# Patient Record
Sex: Female | Born: 1935
Health system: Southern US, Community
[De-identification: ages and names within clinical notes are randomized; demographics above are authoritative.]

## PROBLEM LIST (undated history)

## (undated) DIAGNOSIS — F0390 Unspecified dementia without behavioral disturbance: Secondary | ICD-10-CM

---

## 1998-09-15 ENCOUNTER — Other Ambulatory Visit: Admission: RE | Admit: 1998-09-15 | Discharge: 1998-09-15 | Payer: Self-pay | Admitting: *Deleted

## 1999-09-25 ENCOUNTER — Other Ambulatory Visit: Admission: RE | Admit: 1999-09-25 | Discharge: 1999-09-25 | Payer: Self-pay | Admitting: *Deleted

## 2000-09-25 ENCOUNTER — Other Ambulatory Visit: Admission: RE | Admit: 2000-09-25 | Discharge: 2000-09-25 | Payer: Self-pay | Admitting: *Deleted

## 2002-09-30 ENCOUNTER — Other Ambulatory Visit: Admission: RE | Admit: 2002-09-30 | Discharge: 2002-09-30 | Payer: Self-pay | Admitting: Family Medicine

## 2003-10-14 ENCOUNTER — Other Ambulatory Visit: Admission: RE | Admit: 2003-10-14 | Discharge: 2003-10-14 | Payer: Self-pay | Admitting: Family Medicine

## 2004-10-16 ENCOUNTER — Other Ambulatory Visit: Admission: RE | Admit: 2004-10-16 | Discharge: 2004-10-16 | Payer: Self-pay | Admitting: Family Medicine

## 2004-10-31 ENCOUNTER — Ambulatory Visit: Payer: Self-pay | Admitting: Family Medicine

## 2015-05-30 ENCOUNTER — Other Ambulatory Visit: Payer: Self-pay

## 2015-08-02 DIAGNOSIS — Z803 Family history of malignant neoplasm of breast: Secondary | ICD-10-CM | POA: Diagnosis not present

## 2015-08-02 DIAGNOSIS — Z1231 Encounter for screening mammogram for malignant neoplasm of breast: Secondary | ICD-10-CM | POA: Diagnosis not present

## 2015-11-04 DIAGNOSIS — W57XXXA Bitten or stung by nonvenomous insect and other nonvenomous arthropods, initial encounter: Secondary | ICD-10-CM | POA: Diagnosis not present

## 2015-11-22 DIAGNOSIS — L600 Ingrowing nail: Secondary | ICD-10-CM | POA: Diagnosis not present

## 2015-11-22 DIAGNOSIS — M2022 Hallux rigidus, left foot: Secondary | ICD-10-CM | POA: Diagnosis not present

## 2015-11-22 DIAGNOSIS — M2021 Hallux rigidus, right foot: Secondary | ICD-10-CM | POA: Diagnosis not present

## 2015-11-22 DIAGNOSIS — L03031 Cellulitis of right toe: Secondary | ICD-10-CM | POA: Diagnosis not present

## 2015-12-14 DIAGNOSIS — R413 Other amnesia: Secondary | ICD-10-CM | POA: Diagnosis not present

## 2015-12-16 ENCOUNTER — Other Ambulatory Visit: Payer: Self-pay | Admitting: Family Medicine

## 2015-12-16 DIAGNOSIS — R413 Other amnesia: Secondary | ICD-10-CM

## 2015-12-29 ENCOUNTER — Ambulatory Visit
Admission: RE | Admit: 2015-12-29 | Discharge: 2015-12-29 | Disposition: A | Discharge status: Home / Self Care | Payer: PPO | Source: Ambulatory Visit | Attending: Family Medicine | Admitting: Family Medicine

## 2015-12-29 DIAGNOSIS — R413 Other amnesia: Secondary | ICD-10-CM | POA: Diagnosis not present

## 2016-01-19 DIAGNOSIS — R413 Other amnesia: Secondary | ICD-10-CM | POA: Diagnosis not present

## 2016-01-19 DIAGNOSIS — M810 Age-related osteoporosis without current pathological fracture: Secondary | ICD-10-CM | POA: Diagnosis not present

## 2016-03-08 DIAGNOSIS — R208 Other disturbances of skin sensation: Secondary | ICD-10-CM | POA: Diagnosis not present

## 2016-03-08 DIAGNOSIS — B078 Other viral warts: Secondary | ICD-10-CM | POA: Diagnosis not present

## 2016-03-08 DIAGNOSIS — L821 Other seborrheic keratosis: Secondary | ICD-10-CM | POA: Diagnosis not present

## 2016-06-15 DIAGNOSIS — R413 Other amnesia: Secondary | ICD-10-CM | POA: Diagnosis not present

## 2016-06-15 DIAGNOSIS — R350 Frequency of micturition: Secondary | ICD-10-CM | POA: Diagnosis not present

## 2016-06-15 DIAGNOSIS — Z1211 Encounter for screening for malignant neoplasm of colon: Secondary | ICD-10-CM | POA: Diagnosis not present

## 2016-06-25 DIAGNOSIS — Z0000 Encounter for general adult medical examination without abnormal findings: Secondary | ICD-10-CM | POA: Diagnosis not present

## 2016-06-25 DIAGNOSIS — R195 Other fecal abnormalities: Secondary | ICD-10-CM | POA: Diagnosis not present

## 2016-06-25 DIAGNOSIS — R413 Other amnesia: Secondary | ICD-10-CM | POA: Diagnosis not present

## 2016-06-26 DIAGNOSIS — Z1211 Encounter for screening for malignant neoplasm of colon: Secondary | ICD-10-CM | POA: Diagnosis not present

## 2016-08-07 DIAGNOSIS — R899 Unspecified abnormal finding in specimens from other organs, systems and tissues: Secondary | ICD-10-CM | POA: Diagnosis not present

## 2016-08-22 ENCOUNTER — Ambulatory Visit (INDEPENDENT_AMBULATORY_CARE_PROVIDER_SITE_OTHER): Payer: PPO | Admitting: Neurology

## 2016-08-22 ENCOUNTER — Encounter: Payer: Self-pay | Admitting: Neurology

## 2016-08-22 VITALS — BP 126/72 | HR 62 | Ht 65.5 in | Wt 114.1 lb

## 2016-08-22 DIAGNOSIS — G3184 Mild cognitive impairment, so stated: Secondary | ICD-10-CM

## 2016-08-22 NOTE — Progress Notes (Signed)
NEUROLOGY CONSULTATION NOTE  Jaclyn Robertson Fazzino MRN: [id number] DOB: 10-04-1934  Referring provider: Chauncey ReadingKristin Gordon, PA-C Primary care provider: Dr. Joette CatchingLeonard Nyland  Reason for consult:  Memory loss  Thank you for your kind referral of Jaclyn Robertson Mclees for consultation of the above symptoms. Although her history is well known to you, please allow me to reiterate it for the purpose of our medical record. The patient was accompanied to the clinic by her husband who also provides collateral information. Records and images were personally reviewed where available.  HISTORY OF PRESENT ILLNESS: This is a 82 year old left-handed woman presenting for evaluation of worsening memory. Her husband stated noticing changes a couple of years ago, she would sometimes ask the same questions repeatedly. She talked to her prior PCP about this last fall, she would mostly have difficulty remembering names, schedules. She would get lost driving, forgetting she was supposed to turn. This has happened more in unfamiliar roads, but sometimes she has missed a turn going home. She denies any missed bill payments, no missed medications. She continues to cook without leaving the stove on. She misplaces things frequently. She has been told she repeats herself, but reports that hearing is also a problem. She has noticed more word-finding difficulties in the past few months. 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 She has been taking Aricept 10mg  daily and has had "wild crazy dreams" at night. She states Xanax was started for the dreams. She denies any anxiety but her husband states she gets upset at herself easily. No paranoia or hallucinations.   She denies any headaches, dizziness, diplopia, dysarthria, dysphagia, neck/back pain,  focal numbness/tingling/weakness. She has very frequent urination with rare incontinence, occasional constipation. No anosmia, tremors, no falls. She denies any significant head injuries. No family history of dementia. She drinks 1-2 glasses of wine every night.    PAST MEDICAL HISTORY: History reviewed. No pertinent past medical history.  PAST SURGICAL HISTORY: History reviewed. No pertinent surgical history.  MEDICATIONS:  Outpatient Encounter Prescriptions as of 08/22/2016  Medication Sig  . ALPRAZolam (XANAX) 0.25 MG tablet TAKE 1 TABLET AT BEDTIME ONLY AS NEEDED FOR ANXIETY/SLEEP  . donepezil (ARICEPT) 10 MG tablet TAKE ONE TABLET AT BEDTIME  . Multiple Vitamins-Minerals (CENTRUM SILVER ULTRA WOMENS) TABS Take by mouth.  . raloxifene (EVISTA) 60 MG tablet TAKE 1 TABLET ONCE A DAY   No facility-administered encounter medications on file as of 08/22/2016.     ALLERGIES: Allergies not on file  FAMILY HISTORY: No family history on file.  SOCIAL HISTORY: Social History   Social History  . Marital status: Married    Spouse name: [name]  . Number of children: [name]  . Years of education: [name]   Occupational History  . Not on file.   Social History Main Topics  . Smoking status: Not on file  . Smokeless tobacco: Not on file  . Alcohol use Not on file  . Drug use: Unknown  . Sexual activity: Not on file   Other Topics Concern  . Not on file   Social History Narrative  . No narrative on file    REVIEW OF SYSTEMS: Constitutional: No fevers, chills, or sweats, no generalized fatigue, change in appetite Eyes: No visual changes, double vision, eye pain Ear,  nose and throat: No hearing loss, ear pain, nasal congestion, sore throat Cardiovascular: No chest pain, palpitations Respiratory:  No shortness of breath at rest or with exertion, wheezes GastrointestinaI: No nausea, vomiting, diarrhea, abdominal pain, fecal incontinence Genitourinary:  No dysuria, urinary retention,  +frequency Musculoskeletal:  No neck pain, back pain Integumentary: No rash, pruritus, skin lesions Neurological: as above Psychiatric: No depression, insomnia, anxiety Endocrine: No palpitations, fatigue, diaphoresis, mood swings, change in appetite, change in weight, increased thirst Hematologic/Lymphatic:  No anemia, purpura, petechiae. Allergic/Immunologic: no itchy/runny eyes, nasal congestion, recent allergic reactions, rashes  PHYSICAL EXAM: Vitals:   08/22/16 1035  BP: 126/72  Pulse: 62   General: No acute distress Head:  Normocephalic/atraumatic Eyes: Fundoscopic exam shows bilateral sharp discs, no vessel changes, exudates, or hemorrhages Neck: supple, no paraspinal tenderness, full range of motion Back: No paraspinal tenderness Heart: regular rate and rhythm Lungs: Clear to auscultation bilaterally. Vascular: No carotid bruits. Skin/Extremities: No rash, no edema Neurological Exam: Mental status: alert and oriented to person, place, and time, no dysarthria or aphasia, Fund of knowledge is appropriate.  Remote memory intact.  Attention and concentration are normal.    Able to name objects and repeat phrases.  Montreal Cognitive Assessment  08/22/2016  Visuospatial/ Executive (0/5) 5  Naming (0/3) 3  Attention: Read list of digits (0/2) 2  Attention: Read list of letters (0/1) 1  Attention: Serial 7 subtraction starting at 100 (0/3) 3  Language: Repeat phrase (0/2) 2  Language : Fluency (0/1) 1  Abstraction (0/2) 1  Delayed Recall (0/5) 0  Orientation (0/6) 5  Total 23   Cranial nerves: CN I: not tested CN II: pupils equal, round and reactive to light, visual fields intact, fundi unremarkable. CN III, IV, VI:  full range of motion, no nystagmus, no ptosis CN V: facial sensation intact CN VII: upper and lower face symmetric CN VIII: hearing intact to finger rub CN IX, X: gag intact, uvula midline CN XI: sternocleidomastoid and trapezius muscles intact CN XII:  tongue midline Bulk & Tone: normal, no fasciculations. Motor: 5/5 throughout with no pronator drift. Sensation: intact to light touch, cold, pin, vibration and joint position sense.  No extinction to double simultaneous stimulation.  Romberg test negative Deep Tendon Reflexes: +2 throughout, no ankle clonus Plantar responses: downgoing bilaterally Cerebellar: no incoordination on finger to nose, heel to shin. No dysdiadochokinesia Gait: narrow-based and steady, able to tandem walk adequately. Tremor: none  IMPRESSION: This is a 82 year old left-handed woman presenting for evaluation of worsening memory. Her neurological exam is non-focal, MOCA score today 23/30, indicating mild cognitive impairment. We discussed that mild cognitive impairment means there are serious cognitive problems by report and testing but the patient is functioning normally. Around 50% of MCI patients progress to dementia (functional impairment) over 5 years. Dementia implies that ADLs are currently compromised. MRI brain without contrast will be ordered to assess for underlying structural abnormality and assess vascular load. We discussed different causes of memory changes, check TSH and B12 is not done. We discussed effects of anxiety and stress on memory, she is noted to be somewhat anxious in the office today. She is taking Aricept 10mg  daily, continue current dose. She reports weird dreams, which can be a side effect of Aricept, she will start taking it in the daytime. We discussed the importance of control of blood pressure, cholesterol, as well as physical exercise and brain stimulation exercises for brain health. Continue to monitor driving. She will follow-up  in 6 months and knows to call for any changes.   Thank you for allowing me to participate in the care of this patient. Please do not hesitate to call for any questions or concerns.   Patrcia Dolly, M.D.  CC: Chauncey Reading, PA-C

## 2016-08-22 NOTE — Patient Instructions (Signed)
1. Schedule MRI brain without contrast 2. Continue Aricept 10mg , take it in the morning instead 3. Continue to monitor anxiety/stress as this can affect memory 4. Control of blood pressure, cholesterol, as well as physical exercise and brain stimulation exercises are important for brain health. Look into the Mediterranean diet, which has been found to be helpful for memory as well 5. Monitor driving 6. Follow-up in 6 months, call for any changes

## 2016-08-23 DIAGNOSIS — G3184 Mild cognitive impairment, so stated: Secondary | ICD-10-CM | POA: Insufficient documentation

## 2016-09-04 ENCOUNTER — Telehealth: Payer: Self-pay | Admitting: Neurology

## 2016-09-04 NOTE — Telephone Encounter (Signed)
[facility] and schedule.

## 2016-09-04 NOTE — Telephone Encounter (Signed)
Gerda DissJulie Rains 19-Oct-2034. Her # [phone] Erin (Daughter) called regarding an MRI for her mother. She is unsure if her mother has received a call regarding setting up the MRI. Thank you

## 2016-09-17 ENCOUNTER — Telehealth: Payer: Self-pay | Admitting: Neurology

## 2016-09-17 NOTE — Telephone Encounter (Signed)
PT called and said she does not remember what she is supposed to do that Dr Karel JarvisAquino suggested/Dawn CB# [phone]

## 2016-09-17 NOTE — Telephone Encounter (Signed)
Clld pt - LMOVMTC re her call this morning.

## 2016-09-25 ENCOUNTER — Ambulatory Visit
Admission: RE | Admit: 2016-09-25 | Discharge: 2016-09-25 | Disposition: A | Discharge status: Home / Self Care | Payer: PPO | Source: Ambulatory Visit | Attending: Neurology | Admitting: Neurology

## 2016-09-25 DIAGNOSIS — R413 Other amnesia: Secondary | ICD-10-CM | POA: Diagnosis not present

## 2016-09-25 DIAGNOSIS — G3184 Mild cognitive impairment, so stated: Secondary | ICD-10-CM

## 2016-10-04 ENCOUNTER — Telehealth: Payer: Self-pay | Admitting: Neurology

## 2016-10-04 NOTE — Telephone Encounter (Signed)
PT wanted you to have her cell phone number  CB# [phone]

## 2016-10-04 NOTE — Telephone Encounter (Signed)
PT returned your call regarding MRI results

## 2016-10-05 NOTE — Telephone Encounter (Signed)
Refer to lab results note. 

## 2016-10-23 DIAGNOSIS — R350 Frequency of micturition: Secondary | ICD-10-CM | POA: Diagnosis not present

## 2016-10-23 DIAGNOSIS — R4189 Other symptoms and signs involving cognitive functions and awareness: Secondary | ICD-10-CM | POA: Diagnosis not present

## 2016-10-23 DIAGNOSIS — R634 Abnormal weight loss: Secondary | ICD-10-CM | POA: Diagnosis not present

## 2016-10-23 DIAGNOSIS — G4700 Insomnia, unspecified: Secondary | ICD-10-CM | POA: Diagnosis not present

## 2016-12-12 DIAGNOSIS — S7012XA Contusion of left thigh, initial encounter: Secondary | ICD-10-CM | POA: Diagnosis not present

## 2016-12-12 DIAGNOSIS — T148XXA Other injury of unspecified body region, initial encounter: Secondary | ICD-10-CM | POA: Diagnosis not present

## 2016-12-12 DIAGNOSIS — S300XXA Contusion of lower back and pelvis, initial encounter: Secondary | ICD-10-CM | POA: Diagnosis not present

## 2016-12-12 DIAGNOSIS — W19XXXA Unspecified fall, initial encounter: Secondary | ICD-10-CM | POA: Diagnosis not present

## 2016-12-14 DIAGNOSIS — M25552 Pain in left hip: Secondary | ICD-10-CM | POA: Diagnosis not present

## 2017-02-13 DIAGNOSIS — R350 Frequency of micturition: Secondary | ICD-10-CM | POA: Diagnosis not present

## 2017-02-13 DIAGNOSIS — R413 Other amnesia: Secondary | ICD-10-CM | POA: Diagnosis not present

## 2017-02-20 ENCOUNTER — Ambulatory Visit (INDEPENDENT_AMBULATORY_CARE_PROVIDER_SITE_OTHER): Payer: PPO | Admitting: Neurology

## 2017-02-20 ENCOUNTER — Encounter: Payer: Self-pay | Admitting: Neurology

## 2017-02-20 VITALS — BP 112/70 | HR 48 | Ht 64.5 in | Wt 113.0 lb

## 2017-02-20 DIAGNOSIS — G3184 Mild cognitive impairment, so stated: Secondary | ICD-10-CM | POA: Diagnosis not present

## 2017-02-20 NOTE — Progress Notes (Signed)
NEUROLOGY FOLLOW UP OFFICE NOTE  Jaclyn Robertson [id number] September 13, 1934  HISTORY OF PRESENT ILLNESS: I had the pleasure of seeing Jaclyn Robertson in follow-up in the neurology clinic on 02/20/2017.  The patient was last seen 6 months ago for worsening memory. MOCA score in February 2018 was 23/30. She is again accompanied by her husband who helps supplement the history today.  Records and images were personally reviewed where available.  I personally reviewed MRI brain without contrast done 09/25/16 which did not show any acute changes, there was moderate diffuse atrophy, mild chronic microvascular disease. She was taking Aricept, Namenda was added by her PCP last May. She is not sure if she is taking the Namenda. She states she only takes 3 pills every morning, remembers she takes a multivitamin, Evista, and maybe Donepezil. She reported she does not take anything at night, then later on stated she takes Xanax for sleep. She repeats several times during the visit that they had asked the pharmacy to fax us what she is taking. She does not think her memory is very good, her husband agrees, and speaks to me privately after the visit to express concern about Alzheimer's disease. She denies getting lost driving, she only drives locally. She states she knows where she is, but sometimes has to think of where she wants to go. Her husband is mostly in charge of bills, she she keeps up with the some of her own bills. She has word-finding difficulties. No personality changes. No difficulties with ADLs. She denies any headaches, dizziness, diplopia, dysarthria, dysphagia, neck/back pain, focal numbness/tingling/weakness. No falls.   HPI 08/22/2016: This is a 82 yo LH woman with worsening memory. Her husband stated noticing changes a couple of years ago, she would sometimes ask the same questions repeatedly. She talked to her prior PCP about this last fall, she would mostly have difficulty remembering names, schedules. She  would get lost driving, forgetting she was supposed to turn. This has happened more in unfamiliar roads, but sometimes she has missed a turn going home. She denies any missed bill payments, no missed medications. She continues to cook without leaving the stove on. She misplaces things frequently. She has been told she repeats herself, but reports that hearing is also a problem. She has noticed more word-finding difficulties in the past few months. 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 She has been taking Aricept 10mg  daily and has had "wild crazy dreams" at night. She states Xanax was started for the dreams. She denies any anxiety but her husband states she gets upset at herself easily. No paranoia or hallucinations. She denies any significant head injuries. No family history of dementia. She drinks 1-2 glasses of wine every night.   PAST MEDICAL HISTORY: No past medical history on file.  MEDICATIONS:  Outpatient Encounter Prescriptions as of 02/20/2017  Medication Sig  . ALPRAZolam (XANAX) 0.25 MG tablet TAKE 1 TABLET AT BEDTIME ONLY AS NEEDED FOR ANXIETY/SLEEP  . donepezil (ARICEPT) 10 MG tablet TAKE ONE TABLET AT BEDTIME  . Multiple Vitamins-Minerals (CENTRUM SILVER ULTRA WOMENS) TABS Take by mouth.  . raloxifene (EVISTA) 60 MG tablet TAKE 1 TABLET ONCE A DAY   No facility-administered encounter medications on file as of 02/20/2017.     ALLERGIES: Allergies  Allergen  Reactions  . Levaquin  [Levofloxacin In D5w] Rash    FAMILY HISTORY: Family History  Problem Relation Age of Onset  . Seizures Mother     SOCIAL HISTORY: Social History   Social History  . Marital status: Married    Spouse name: [name]  . Number of children: [name]  . Years of education: [name]   Occupational History  .  Not on file.   Social History Main Topics  . Smoking status: Never Smoker  . Smokeless tobacco: Never Used  . Alcohol use 1.2 oz/week    2 Glasses of wine per week     Comment: Occ.  . Drug use: No  . Sexual activity: Not on file   Other Topics Concern  . Not on file   Social History Narrative  . No narrative on file    REVIEW OF SYSTEMS: Constitutional: No fevers, chills, or sweats, no generalized fatigue, change in appetite Eyes: No visual changes, double vision, eye pain Ear, nose and throat: No hearing loss, ear pain, nasal congestion, sore throat Cardiovascular: No chest pain, palpitations Respiratory:  No shortness of breath at rest or with exertion, wheezes GastrointestinaI: No nausea, vomiting, diarrhea, abdominal pain, fecal incontinence Genitourinary:  No dysuria, urinary retention or frequency Musculoskeletal:  No neck pain, back pain Integumentary: No rash, pruritus, skin lesions Neurological: as above Psychiatric: No depression, insomnia, anxiety Endocrine: No palpitations, fatigue, diaphoresis, mood swings, change in appetite, change in weight, increased thirst Hematologic/Lymphatic:  No anemia, purpura, petechiae. Allergic/Immunologic: no itchy/runny eyes, nasal congestion, recent allergic reactions, rashes  PHYSICAL EXAM: Vitals:   02/20/17 0959  BP: 112/70  Pulse: (!) 48   General: No acute distress Head:  Normocephalic/atraumatic Neck: supple, no paraspinal tenderness, full range of motion Heart:  Regular rate and rhythm Lungs:  Clear to auscultation bilaterally Back: No paraspinal tenderness Skin/Extremities: No rash, no edema Neurological Exam: alert and oriented to person, place, and time. No aphasia or dysarthria. Fund of knowledge is appropriate.  Recent and remote memory are impaired.  Attention and concentration are normal.    Able to name objects and repeat phrases.  Montreal Cognitive Assessment  02/20/2017 08/22/2016  Visuospatial/ Executive  (0/5) 4 5  Naming (0/3) 3 3  Attention: Read list of digits (0/2) 2 2  Attention: Read list of letters (0/1) 1 1  Attention: Serial 7 subtraction starting at 100 (0/3) 3 3  Language: Repeat phrase (0/2) 2 2  Language : Fluency (0/1) 0 1  Abstraction (0/2) 2 1  Delayed Recall (0/5) 1 0  Orientation (0/6) 5 5  Total 23 23   Cranial nerves: Pupils equal, round.  Extraocular movements intact with no nystagmus. No facial asymmetry. Motor: moves all extremities symmetrically. Gait narrow-based and steady. No ataxia.  IMPRESSION: This is a 82 yo LH woman with worsening memory. Her neurological exam is non-focal, MOCA score today 23/30, similar to last February 2018. She and her husband report worsening, with difficulty saying which medications she is taking. Although MOCA score indicates mild cognitive impairment, symptoms suggestive of mild dementia. MRI brain showed moderate diffuse atrophy. She will be scheduled for Neurocognitive testing to further evaluate memory concerns. She is unsure which medications she is taking, it appears she is taking Donepezil, and possibly Memantine now as well. She will check the bottles at home and confirm with [place]. We discussed home safety, continue to monitor driving, using different strategies to help with memory, as well ask bringing an updated list of medications in  her pocketbook at all times. We again discussed the importance of control of blood pressure, cholesterol, as well as physical exercise and brain stimulation exercises for brain health. She will follow-up in 6 months and knows to call for any changes.  Thank you for allowing me to participate in her care.  Please do not hesitate to call for any questions or concerns.  The duration of this appointment visit was 25 minutes of face-to-face time with the patient.  Greater than 50% of this time was spent in counseling, explanation of diagnosis, planning of further management, and coordination of  care.   Patrcia Dolly, M.D.   CC: Dr. Lysbeth Galas

## 2017-02-20 NOTE — Patient Instructions (Signed)
1. Schedule Neurocognitive testing with Dr. Alinda DoomsBailar 2. Please check on your medications and call our office to let us know what you are taking. Memantine (Namenda) is listed, but please see if you are taking this medication, in addition to Donepezil (Aricept) 3. Control of blood pressure, cholesterol, as well as physical exercise and brain stimulation exercises are important for brain health 4. Follow-up in 6 months, call for any changes

## 2017-04-16 DIAGNOSIS — Z23 Encounter for immunization: Secondary | ICD-10-CM | POA: Diagnosis not present

## 2017-04-17 DIAGNOSIS — R350 Frequency of micturition: Secondary | ICD-10-CM | POA: Diagnosis not present

## 2017-05-02 ENCOUNTER — Encounter: Payer: PPO | Admitting: Psychology

## 2017-05-27 DIAGNOSIS — H25813 Combined forms of age-related cataract, bilateral: Secondary | ICD-10-CM | POA: Diagnosis not present

## 2017-05-27 DIAGNOSIS — H524 Presbyopia: Secondary | ICD-10-CM | POA: Diagnosis not present

## 2017-05-27 DIAGNOSIS — H5203 Hypermetropia, bilateral: Secondary | ICD-10-CM | POA: Diagnosis not present

## 2017-05-27 DIAGNOSIS — H52223 Regular astigmatism, bilateral: Secondary | ICD-10-CM | POA: Diagnosis not present

## 2017-06-04 ENCOUNTER — Encounter: Payer: PPO | Admitting: Psychology

## 2017-06-27 DIAGNOSIS — H25813 Combined forms of age-related cataract, bilateral: Secondary | ICD-10-CM | POA: Diagnosis not present

## 2017-06-27 DIAGNOSIS — H52223 Regular astigmatism, bilateral: Secondary | ICD-10-CM | POA: Diagnosis not present

## 2017-07-04 ENCOUNTER — Ambulatory Visit: Payer: PPO | Admitting: Psychology

## 2017-07-04 ENCOUNTER — Encounter: Payer: Self-pay | Admitting: Psychology

## 2017-07-04 ENCOUNTER — Ambulatory Visit (INDEPENDENT_AMBULATORY_CARE_PROVIDER_SITE_OTHER): Payer: PPO | Admitting: Psychology

## 2017-07-04 DIAGNOSIS — R413 Other amnesia: Secondary | ICD-10-CM | POA: Diagnosis not present

## 2017-07-04 DIAGNOSIS — G3184 Mild cognitive impairment, so stated: Secondary | ICD-10-CM

## 2017-07-04 NOTE — Progress Notes (Signed)
NEUROPSYCHOLOGICAL INTERVIEW (CPT: [id number])  Name: Jaclyn Robertson Date of Birth: 07-10-1935 Date of Interview: 07/04/2017  Reason for Referral:  Jaclyn Robertson is a 82 [facility] who is referred for neuropsychological evaluation by Dr. Patrcia DollyKaren Aquino of [facility] due to concerns about worsening memory. This patient is accompanied in the office by her husband, Jaclyn Robertson, who supplements the history.  History of Presenting Problem:  Jaclyn Robertson was seen by Dr. Karel JarvisAquino for evaluation of memory loss on 08/22/2016. MoCA was 23/30. She was already taking Aricept prescribed by her PCP. MRI of the brain completed on 09/25/2016 revealed moderate global atrophy and mild chronic microvascular changes. She followed up with Dr. Karel JarvisAquino on 02/20/2017. MoCA was again 23/30 but her husband expressed concerns about worsening memory and the possibility of Alzheimer's disease. There is no known family history of dementia.  At today's visit (07/04/2017), the patient and her husband report gradual onset a couple of years ago with worsening over time. The patient complains of difficulty remembering people's names. She also realizes that she has to ask her husband multiple times about plans they have. He agrees she forgets information she has recently been told. However she does not seem to forget recent events. She endorses some word finding difficulty. Her husband says she has always had halting speech style. She may also have some reduced auditory comprehension, but her husband notes this could be due to hearing loss. She does not wear her hearing aids, and reports they need to be repaired. They deny any significant changes in attention/concentration. They deny any problems with driving and navigating in familiar areas. She limits her driving to daytime and only in familiar areas. She has not gotten lost.   She continues to manage all instrumental ADLs. She manages her medications and denies any difficulty with this although  Dr. Rosalyn GessAquino's notes mention some confusion about which ones she is taking. She manages finances and does pretty well with this but there was some confusion recently although she did not miss any payments. She cooks every day and they deny any problems doing this. She writes down her appointments and has to check the calendar more frequently but has never missed an appointment.  Physically, she has no complaints. She exercises daily. She denies any trouble with balance or walking. She reported no significant sleep difficulty although she does awaken frequently to use the restroom. Records indicate she was prescribed Xanax by her PCP either for anxiety or sleep. Her appetite is good. She has 1-2 glasses of wine every night.   Her mood is good. She has no psychiatric history. She has had some stress related to her son's finances lately. She denies significant anxiety.   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 Retired. Marital history: Married x57 years. They adopted two children and then had a biological child. They have 5 grandchildren with another on the way. Alcohol: 1-2 glasses of wine per night Tobacco: Never a smoker or tobacco user.   Medical History: No past medical history on file.    Current Medications:  Outpatient Encounter Medications as of 07/04/2017  Medication Sig  . ALPRAZolam (XANAX) 0.25 MG tablet TAKE 1 TABLET AT BEDTIME ONLY AS NEEDED FOR ANXIETY/SLEEP  . donepezil (ARICEPT) 10 MG tablet TAKE ONE TABLET AT BEDTIME  . Multiple Vitamins-Minerals (CENTRUM SILVER ULTRA WOMENS) TABS Take by mouth.  . raloxifene (EVISTA) 60 MG tablet TAKE 1 TABLET ONCE A  DAY   No facility-administered encounter medications on file as of 07/04/2017.      Behavioral Observations:   Appearance: Neatly and appropriately dressed and groomed, appearing somewhat younger than her chronological  age Gait: Ambulated independently, no gross abnormalities observed Speech: Fluent; mildly halting at times but generally normal rate, rhythm and volume. Increased response latencies. Often looks to her husband when she is asked a question. Thought process: Linear, goal directed Affect: Mildly restricted in range but euthymic, mildly anxious Interpersonal: Pleasant, appropriate   TESTING: There is medical necessity to proceed with neuropsychological assessment as the results will be used to aid in differential diagnosis and clinical decision-making and to inform specific treatment recommendations. Per the patient, her husband and medical records reviewed, there has been a change in cognitive functioning and a reasonable suspicion of neurocognitive disorder (rule out prodromal AD).  Following the clinical interview, the patient completed a full battery of neuropsychological testing with my psychometrician under my supervision.   PLAN: The patient will return to see me for a follow-up session at which time her test performances and my impressions and treatment recommendations will be reviewed in detail.  Full report to follow.

## 2017-07-04 NOTE — Progress Notes (Signed)
   Neuropsychology Note  Jaclyn Robertson came in today for 1 hour of neuropsyCharmayne Sheerchological testing with Robertson, Jaclyn Robertson, Jaclyn Robertson, Jaclyn Robertson of Dr. Elvis CoilMaryBeth Bailar. The patient did not appear overtly distressed by the testing session, per behavioral observation or via self-report to the Robertson. Rest breaks were offered. Jaclyn SheerJulie S Siegert will return within 2 weeks for a feedback session with Dr. Alinda DoomsBailar at which time her test performances, clinical impressions and treatment recommendations will be reviewed in detail. The patient understands she can contact our office should she require our assistance before this time.  Full report to follow.

## 2017-07-29 ENCOUNTER — Other Ambulatory Visit (HOSPITAL_COMMUNITY): Payer: PPO

## 2017-07-31 NOTE — Progress Notes (Signed)
NEUROPSYCHOLOGICAL EVALUATION   Name:    Jaclyn Robertson  Date of Birth:   1935-06-07 Date of Interview:  07/04/2017 Date of Testing:  07/04/2017   Date of Feedback:  08/01/2017       Background Information:  Reason for Referral:  Jaclyn Robertson is a 83 y.o. female referred by Dr. Ellouise Newer to assess her current level of cognitive functioning and assist in differential diagnosis. The current evaluation consisted of a review of available medical records, an interview with the patient and her husband, Ron, and the completion of a neuropsychological testing battery. Informed consent was obtained.  History of Presenting Problem:  Jaclyn Robertson was seen by Dr. Delice Lesch for evaluation of memory loss on 08/22/2016. MoCA was 23/30. She was already taking Aricept prescribed by her PCP. MRI of the brain completed on 09/25/2016 revealed moderate global atrophy and mild chronic microvascular changes. She followed up with Dr. Delice Lesch on 02/20/2017. MoCA was again 23/30 but her husband expressed concerns about worsening memory and the possibility of Alzheimer's disease. There is no known family history of dementia.  At today's visit (07/04/2017), the patient and her husband report gradual onset a couple of years ago with worsening over time. The patient complains of difficulty remembering people's names. She also realizes that she has to ask her husband multiple times about plans they have. He agrees she forgets information she has recently been told. However she does not seem to forget recent events. She endorses some word finding difficulty. Her husband says she has always had halting speech style. She may also have some reduced auditory comprehension, but her husband notes this could be due to hearing loss. She does not wear her hearing aids, and reports they need to be repaired. They deny any significant changes in attention/concentration. They deny any problems with driving and navigating in familiar areas. She  limits her driving to daytime and only in familiar areas. She has not gotten lost.   She continues to manage all instrumental ADLs. She manages her medications and denies any difficulty with this although Dr. Amparo Bristol notes mention some confusion about which ones she is taking. She manages finances and does pretty well with this but there was some confusion recently although she did not miss any payments. She cooks every day and they deny any problems doing this. She writes down her appointments and has to check the calendar more frequently but has never missed an appointment.  Physically, she has no complaints. She exercises daily. She denies any trouble with balance or walking. She reported no significant sleep difficulty although she does awaken frequently to use the restroom. Records indicate she was prescribed Xanax by her PCP either for anxiety or sleep. Her appetite is good. She has 1-2 glasses of wine every night.   Her mood is good. She has no psychiatric history. She has had some stress related to her son's finances lately. She denies significant anxiety.   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 Marital history: Married x57 years. They adopted two children and then had a biological child. They have 5 grandchildren with another one on the way. Alcohol: 1-2 glasses of wine per night Tobacco: Never a smoker or tobacco user.   Medical History: No past medical history on file.  Current medications:  Outpatient Encounter Medications as of 08/01/2017  Medication Sig  . ALPRAZolam (XANAX) 0.25 MG tablet TAKE 1 TABLET AT BEDTIME  ONLY AS NEEDED FOR ANXIETY/SLEEP  . donepezil (ARICEPT) 10 MG tablet TAKE ONE TABLET AT BEDTIME  . Multiple Vitamins-Minerals (CENTRUM SILVER ULTRA WOMENS) TABS Take by mouth.  . raloxifene (EVISTA) 60 MG tablet TAKE 1 TABLET ONCE A DAY   No  facility-administered encounter medications on file as of 08/01/2017.      Current Examination:  Behavioral Observations:  Appearance: Neatly and appropriately dressed and groomed, appearing somewhat younger than her chronological age Gait: Ambulated independently, no gross abnormalities observed Speech: Fluent; mildly halting at times but generally normal rate, rhythm and volume. Increased response latencies. Often looks to her husband when she is asked a question. Thought process: Linear, goal directed Affect: Mildly restricted in range but euthymic, mildly anxious Interpersonal: Pleasant, appropriate Orientation: Oriented to all spheres but was unable to name the current President and his predecessor.   Tests Administered: . Test of Premorbid Functioning (TOPF) . Wechsler Adult Intelligence Scale-Fourth Edition (WAIS-IV): Similarities, Music therapist, Coding and Digit Span subtests . Wechsler Memory Scale-Fourth Edition (WMS-IV) Older Adult Version (ages 62-90): Logical Memory I, II and Recognition subtests  . [employer] Verbal Learning Test - 2nd Edition (CVLT-2) Short Form . Repeatable Battery for the Assessment of Neuropsychological Status (RBANS) Form A:  Figure Copy and Recall subtests and Semantic Fluency subtest . Boston Naming Test (BNT) . Boston Diagnostic Aphasia Examination: Complex Ideational Material subtest . Controlled Oral Word Association Test (COWAT) . Trail Making Test A and B . Clock drawing test . Geriatric Depression Scale (GDS) 15 Item . Generalized Anxiety Disorder - 7 item screener (GAD-7)  Test Results: Note: Standardized scores are presented only for use by appropriately trained professionals and to allow for any future test-retest comparison. These scores should not be interpreted without consideration of all the information that is contained in the rest of the report. The most recent standardization samples from the test publisher or other sources were used  whenever possible to derive standard scores; scores were corrected for age, gender, ethnicity and education when available.   Test Scores:  Test Name Raw Score Standardized Score Descriptor  TOPF 58/70 SS= 116 High average  WAIS-IV Subtests     Similarities 15/36 ss= 7 Low average  Block Design 28/66 ss= 11 Average  Coding 32/135 ss= 8 Low end of average  Digit Span Forward 10/16 ss= 11 Average  Digit Span Backward 7/16 ss= 10 Average  WMS-IV Subtests     LM I 7/53 ss= 3 Impaired  LM II 0/39 ss= 2 Impaired  LM II Recognition 11/23 Cum %: 3-9 Impaired  RBANS Subtests     Figure Copy 19/20 Z= 0.9 High average  Figure Recall 11/20 Z= -0.1 Average  Semantic Fluency 8/40 Z= -2.5 Impaired  CVLT-II Scores     Trial 1 0/9 Z= -3.5 Severely impaired  Trial 4 4/9 Z= -2 Impaired  Trials 1-4 total 12/36 T= 20 Severely impaired  SD Free Recall 0/9 Z= -2.5 Impaired  LD Free Recall 0/9 Z= -2.5 Impaired  LD Cued Recall 2/9 Z= -2.5 Impaired  Recognition Hits 5/9 hits Z= -3.5 Severely impaired  Recognition False Positives 1 Z= 0 Average  Forced Choice Recognition 8/9  Impaired  BNT 28/60 T= 17 Severely impaired  BDAE Subtest     Complex Ideational Material 9/12  Impaired  COWAT-FAS 27 T= 41 Low average  COWAT-Animals 7 T= 28 Impaired  Trail Making Test A  64" 0 errors T= 49 Average  Trail Making Test B  204" 1 error  T= 44 Average  Clock Drawing   WNL  GDS-15 2/15  WNL  GAD-7 2/21  WNL      Description of Test Results:  Premorbid verbal intellectual abilities were estimated to have been within the high average range based on a test of word reading. Psychomotor processing speed was average. Auditory attention and working memory were average. Visual-spatial construction was average. Language abilities were significantly below expectation. Specifically, confrontation naming was severely impaired, and semantic verbal fluency was impaired. Auditory comprehension of complex ideational  material was impaired. With regard to verbal memory, encoding and acquisition of non-contextual information (i.e., word list) was severely impaired. After a brief distracter task, free recall was impaired (0/9 items). After a delay, free recall was impaired (0/9 items). Cued recall was impaired (2/9 items). Performance on a yes/no recognition task was impaired due to low recognition of target items with only one false positive error. Forced choice recognition also was below expectation. On another verbal memory test, encoding and acquisition of contextual auditory information (i.e., short stories) was impaired. After a delay, free recall was severely impaired (no aspects of the original stories were recalled). Performance on a yes/no recognition task was impaired and below chance. With regard to non-verbal memory, delayed free recall of visual information was average. Executive functioning was variable overall. Mental flexibility and set-shifting were average on Trails B. Verbal fluency with phonemic search restrictions was low average. Verbal abstract reasoning was low average. Performance on a clock drawing task was normal. On self-report measures of mood, the patient's responses were not indicative of clinically significant depression or anxiety at the present time.    Clinical Impressions: Mild dementia most likely due to Alzheimer's disease. Results of cognitive testing reveal significant impairments in several domains relative to age-based normative data and relative to her estimated premorbid baseline. Additionally, there is evidence that her cognitive deficits are starting to interfere with her ability to manage complex tasks such as her medications and the finances. As such, diagnostic criteria for a dementia syndrome are met.  Areas of prominent impairment include encoding/consolidation of new verbal information, confrontation naming, and semantic fluency. Relative to estimated baseline, verbal  abstract reasoning and auditory comprehension of complex ideational material are also both reduced. The patient's cognitive profile is very concerning for medial temporal lobe involvement. Based on her cognitive profile, clinical features and moderate atrophy on neuroimaging, Alzheimer's disease is suspected. Fortunately, she is not reporting any significant depression or anxiety. There is no evidence of behavioral disturbance. I would characterize her dementia as mild stage at this time.    Recommendations/Plan: Based on the findings of the present evaluation, the following recommendations are offered:  --The patient and her family will likely benefit from education and support regarding her diagnosis. Information on AD and community resources was provided. --The patient appears to be an appropriate candidate for cholinesterase inhibitor therapy, and as such it is recommended that she continue Aricept. --The patient takes Xanax nightly. Given the risk of increased confusion with benzodiazepines in the elderly (which can be intensified in combination with alcohol), it is recommended that an alternative non-benzodiazepine medication be considered if she does indeed need ongoing treatment for sleep. The patient and her husband would like her to come off Xanax and start melatonin. I have asked Dr. Delice Lesch to advise if she should wean off the Xanax or if it can be discontinued without weaning. The patient will wait until she hears from our office before making a change. --The patient  reportedly has hearing aids but is not wearing them and needs them repaired. It is recommended that she have an updated audiology evaluation to see if new hearing aids or repair of current ones would be helpful for her. While her cognitive impairment is interfering with her ability to encode and consolidation new information, and comprehend complex ideational information, if her hearing can be improved this may assist somewhat in  auditory processing. --Given her memory difficulties, it is recommended that she continue to limit her driving to local/familiar areas and avoid driving in inclement weather or at night. Her driving abilities will need to be monitored over time. --Her family should be aware of her medication list/instructions, and her medications should be monitored by family to make sure they are being taken correctly.  --She is encouraged to continue participating in safe cardiovascular exercise as this will continue to promote physical and emotional wellbeing as well as possibly reduce the rate of cognitive decline. Similarly, she should continue to engage in social activities regularly. --Neurocognitive re-evaluation in 1-2 years is recommended in order to monitor cognitive status, track symptoms and further assist with treatment planning.   Feedback to Patient: Jaclyn Robertson and her husband returned for a feedback appointment on 08/01/2017 to review the results of her neuropsychological evaluation with this provider. 35 minutes face-to-face time was spent reviewing her test results, my impressions and my recommendations as detailed above.    Total time spent on this patient's case: [id number] unit for interview with psychologist; [phone] units of testing by psychometrician under psychologist's supervision; [phone] and [phone] units for integration of patient data, interpretation of standardized test results and clinical data, clinical decision making, treatment planning and preparation of this report, and interactive feedback with review of results to the patient/family by psychologist.      Thank you for your referral of Jaclyn Robertson. Please feel free to contact me if you have any questions or concerns regarding this report.

## 2017-08-01 ENCOUNTER — Encounter: Payer: Self-pay | Admitting: Psychology

## 2017-08-01 ENCOUNTER — Ambulatory Visit (INDEPENDENT_AMBULATORY_CARE_PROVIDER_SITE_OTHER): Payer: PPO | Admitting: Psychology

## 2017-08-01 DIAGNOSIS — F0280 Dementia in other diseases classified elsewhere without behavioral disturbance: Secondary | ICD-10-CM | POA: Diagnosis not present

## 2017-08-01 DIAGNOSIS — G301 Alzheimer's disease with late onset: Secondary | ICD-10-CM | POA: Diagnosis not present

## 2017-08-01 NOTE — Patient Instructions (Signed)
Clinical Impressions: Mild dementia most likely due to Alzheimer's disease. Results of cognitive testing reveal significant impairments in several domains relative to age-based normative data and relative to her estimated premorbid baseline. Additionally, there is evidence that her cognitive deficits are starting to interfere with her ability to manage complex tasks such as her medications and the finances. As such, diagnostic criteria for a dementia syndrome are met.  Areas of prominent impairment include encoding/consolidation of new verbal information, confrontation naming, and semantic fluency. Relative to estimated baseline, verbal abstract reasoning and auditory comprehension of complex ideational material are also both reduced. The patient's cognitive profile is very concerning for medial temporal lobe involvement. Based on her cognitive profile, clinical features and moderate atrophy on neuroimaging, Alzheimer's disease is suspected. Fortunately, she is not reporting any significant depression or anxiety. There is no evidence of behavioral disturbance. I would characterize her dementia as mild stage at this time.    Recommendations/Plan: Based on the findings of the present evaluation, the following recommendations are offered:  --The patient and her family will likely benefit from education and support regarding her diagnosis. Information on AD and community resources was provided. --Continue Aricept. --The patient takes Xanax nightly. Given the risk of increased confusion with benzodiazepines in the elderly (which can be intensified in combination with alcohol), it is recommended that an alternative non-benzodiazepine medication be considered if she does indeed need ongoing treatment for sleep. --The patient reportedly has hearing aids but is not wearing them and needs them repaired. It is recommended that she have an updated audiology evaluation to see if new hearing aids or repair of current  ones would be helpful for her. While her cognitive impairment is interfering with her ability to encode and consolidation new information, and comprehend complex ideational information, if her hearing can be improved this may assist somewhat in auditory processing. --Given her memory difficulties, it is recommended that she continue to limit her driving to local/familiar areas and avoid driving in inclement weather or at night. Her driving abilities will need to be monitored over time. --Her family should be aware of her medication list/instructions, and her medications should be monitored by family to make sure they are being taken correctly.  --She is encouraged to continue participating in safe cardiovascular exercise as this will continue to promote physical and emotional wellbeing as well as possibly reduce the rate of cognitive decline. Similarly, she should continue to engage in social activities regularly. --Neurocognitive re-evaluation in 1-2 years is recommended in order to monitor cognitive status, track symptoms and further assist with treatment planning.

## 2017-08-05 ENCOUNTER — Telehealth: Payer: Self-pay | Admitting: Psychology

## 2017-08-05 NOTE — Telephone Encounter (Signed)
Spoke with pt relaying message below.  She states that she does not have any Xanax on hand and hasn't taken any Xanax recently. When asked the last time she took it, her response was "well, I really can't tell you that.  I just don't remember".  Advised her to speak with her PCP.  Pt states that she has been "sitting around waiting for my doctor to call.  Is this the call I was waiting for?"  I advised that other than the message I just relayed, I was unaware of any other calls coming from our office.  Pt seems confused.  I let her know that it appears that all results have been relayed to her.

## 2017-08-05 NOTE — Telephone Encounter (Signed)
Patient called and said that she was seen on 08/01/17 for her results appointment. She said she was told that she would be hearing from the Doctor? She said she has not heard from anyone? She seemed unsure of who is to call her. Please Advise. Thanks

## 2017-08-05 NOTE — Telephone Encounter (Signed)
Pls let her know I received the results of memory testing from Dr. Alinda DoomsBailar, including recommendation to get off the Xanax at night and consider a different medication for sleep. Pls let her know that we would recommend weaning off the Xanax to 1/2 tablet every night for a week, then stop, but also that since her PCP has been prescribing this, to let them know as well so that a different sleep aid can be prescribed. Thanks

## 2017-08-27 DIAGNOSIS — R413 Other amnesia: Secondary | ICD-10-CM | POA: Diagnosis not present

## 2017-09-05 NOTE — Patient Instructions (Signed)
Your procedure is scheduled on: 09/13/2017   Report to [facility] at   640   AM.  Call this number if you have problems the morning of surgery: [phone]   Do not eat food or drink liquids :After Midnight.      Take these medicines the morning of surgery with A SIP OF WATER: namenda   Do not wear jewelry, make-up or nail polish.  Do not wear lotions, powders, or perfumes. You may wear deodorant.  Do not shave 48 hours prior to surgery.  Do not bring valuables to the hospital.  Contacts, dentures or bridgework may not be worn into surgery.  Leave suitcase in the car. After surgery it may be brought to your room.  For patients admitted to the hospital, checkout time is 11:00 AM the day of discharge.   Patients discharged the day of surgery will not be allowed to drive home.  :     Please read over the following fact sheets that you were given: Coughing and Deep Breathing, Surgical Site Infection Prevention, Anesthesia Post-op Instructions and Care and Recovery After Surgery    Cataract A cataract is a clouding of the lens of the eye. When a lens becomes cloudy, vision is reduced based on the degree and nature of the clouding. Many cataracts reduce vision to some degree. Some cataracts make people more near-sighted as they develop. Other cataracts increase glare. Cataracts that are ignored and become worse can sometimes look white. The white color can be seen through the pupil. CAUSES   Aging. However, cataracts may occur at any age, even in newborns.   Certain drugs.   Trauma to the eye.   Certain diseases such as diabetes.   Specific eye diseases such as chronic inflammation inside the eye or a sudden attack of a rare form of glaucoma.   Inherited or acquired medical problems.  SYMPTOMS   Gradual, progressive drop in vision in the affected eye.   Severe, rapid visual loss. This most often happens when trauma is the cause.  DIAGNOSIS  To detect a cataract, an eye doctor  examines the lens. Cataracts are best diagnosed with an exam of the eyes with the pupils enlarged (dilated) by drops.  TREATMENT  For an early cataract, vision may improve by using different eyeglasses or stronger lighting. If that does not help your vision, surgery is the only effective treatment. A cataract needs to be surgically removed when vision loss interferes with your everyday activities, such as driving, reading, or watching TV. A cataract may also have to be removed if it prevents examination or treatment of another eye problem. Surgery removes the cloudy lens and usually replaces it with a substitute lens (intraocular lens, IOL).  At a time when both you and your doctor agree, the cataract will be surgically removed. If you have cataracts in both eyes, only one is usually removed at a time. This allows the operated eye to heal and be out of danger from any possible problems after surgery (such as infection or poor wound healing). In rare cases, a cataract may be doing damage to your eye. In these cases, your caregiver may advise surgical removal right away. The vast majority of people who have cataract surgery have better vision afterward. HOME CARE INSTRUCTIONS  If you are not planning surgery, you may be asked to do the following:  Use different eyeglasses.   Use stronger or brighter lighting.   Ask your eye doctor about reducing your  medicine dose or changing medicines if it is thought that a medicine caused your cataract. Changing medicines does not make the cataract go away on its own.   Become familiar with your surroundings. Poor vision can lead to injury. Avoid bumping into things on the affected side. You are at a higher risk for tripping or falling.   Exercise extreme care when driving or operating machinery.   Wear sunglasses if you are sensitive to bright light or experiencing problems with glare.  SEEK IMMEDIATE MEDICAL CARE IF:   You have a worsening or sudden vision  loss.   You notice redness, swelling, or increasing pain in the eye.   You have a fever.  Document Released: 07/02/2005 Document Revised: 06/21/2011 Document Reviewed: 02/23/2011 [facility] Patient Information 2012 [employer].PATIENT INSTRUCTIONS POST-ANESTHESIA  IMMEDIATELY FOLLOWING SURGERY:  Do not drive or operate machinery for the first twenty four hours after surgery.  Do not make any important decisions for twenty four hours after surgery or while taking narcotic pain medications or sedatives.  If you develop intractable nausea and vomiting or a severe headache please notify your doctor immediately.  FOLLOW-UP:  Please make an appointment with your surgeon as instructed. You do not need to follow up with anesthesia unless specifically instructed to do so.  WOUND CARE INSTRUCTIONS (if applicable):  Keep a dry clean dressing on the anesthesia/puncture wound site if there is drainage.  Once the wound has quit draining you may leave it open to air.  Generally you should leave the bandage intact for twenty four hours unless there is drainage.  If the epidural site drains for more than 36-48 hours please call the anesthesia department.  QUESTIONS?:  Please feel free to call your physician or the hospital operator if you have any questions, and they will be happy to assist you.

## 2017-09-09 ENCOUNTER — Other Ambulatory Visit: Payer: Self-pay

## 2017-09-09 ENCOUNTER — Encounter (HOSPITAL_COMMUNITY)
Admission: RE | Admit: 2017-09-09 | Discharge: 2017-09-09 | Disposition: A | Discharge status: Home / Self Care | Payer: PPO | Source: Ambulatory Visit | Attending: Ophthalmology | Admitting: Ophthalmology

## 2017-09-09 ENCOUNTER — Encounter (HOSPITAL_COMMUNITY): Payer: Self-pay

## 2017-09-09 DIAGNOSIS — I498 Other specified cardiac arrhythmias: Secondary | ICD-10-CM | POA: Diagnosis not present

## 2017-09-09 DIAGNOSIS — Z01812 Encounter for preprocedural laboratory examination: Secondary | ICD-10-CM | POA: Diagnosis not present

## 2017-09-09 DIAGNOSIS — Z01818 Encounter for other preprocedural examination: Secondary | ICD-10-CM | POA: Insufficient documentation

## 2017-09-09 DIAGNOSIS — H25812 Combined forms of age-related cataract, left eye: Secondary | ICD-10-CM | POA: Diagnosis not present

## 2017-09-09 HISTORY — DX: Unspecified dementia, unspecified severity, without behavioral disturbance, psychotic disturbance, mood disturbance, and anxiety: F03.90

## 2017-09-09 LAB — BASIC METABOLIC PANEL
ANION GAP: 10 (ref 5–15)
BUN: 13 mg/dL (ref 6–20)
CALCIUM: 8.8 mg/dL — AB (ref 8.9–10.3)
CO2: 27 mmol/L (ref 22–32)
Chloride: 104 mmol/L (ref 101–111)
Creatinine, Ser: 0.72 mg/dL (ref 0.44–1.00)
Glucose, Bld: 97 mg/dL (ref 65–99)
Potassium: 4.2 mmol/L (ref 3.5–5.1)
Sodium: 141 mmol/L (ref 135–145)

## 2017-09-09 LAB — CBC WITH DIFFERENTIAL/PLATELET
BASOS ABS: 0 10*3/uL (ref 0.0–0.1)
BASOS PCT: 0 %
EOS PCT: 2 %
Eosinophils Absolute: 0.2 10*3/uL (ref 0.0–0.7)
HCT: 34.6 % — ABNORMAL LOW (ref 36.0–46.0)
Hemoglobin: 11 g/dL — ABNORMAL LOW (ref 12.0–15.0)
Lymphocytes Relative: 12 %
Lymphs Abs: 1.3 10*3/uL (ref 0.7–4.0)
MCH: 31.3 pg (ref 26.0–34.0)
MCHC: 31.8 g/dL (ref 30.0–36.0)
MCV: 98.3 fL (ref 78.0–100.0)
MONO ABS: 1.1 10*3/uL — AB (ref 0.1–1.0)
Monocytes Relative: 10 %
NEUTROS ABS: 8.1 10*3/uL — AB (ref 1.7–7.7)
Neutrophils Relative %: 76 %
PLATELETS: 467 10*3/uL — AB (ref 150–400)
RBC: 3.52 MIL/uL — ABNORMAL LOW (ref 3.87–5.11)
RDW: 12.8 % (ref 11.5–15.5)
WBC: 10.7 10*3/uL — ABNORMAL HIGH (ref 4.0–10.5)

## 2017-09-13 ENCOUNTER — Encounter (HOSPITAL_COMMUNITY): Payer: Self-pay | Admitting: *Deleted

## 2017-09-13 ENCOUNTER — Ambulatory Visit (HOSPITAL_COMMUNITY): Payer: PPO | Admitting: Anesthesiology

## 2017-09-13 ENCOUNTER — Ambulatory Visit (HOSPITAL_COMMUNITY)
Admission: RE | Admit: 2017-09-13 | Discharge: 2017-09-13 | Disposition: A | Discharge status: Home / Self Care | Payer: PPO | Source: Ambulatory Visit | Attending: Ophthalmology | Admitting: Ophthalmology

## 2017-09-13 ENCOUNTER — Encounter (HOSPITAL_COMMUNITY)
Admission: RE | Disposition: A | Discharge status: Home / Self Care | Payer: Self-pay | Source: Ambulatory Visit | Attending: Ophthalmology

## 2017-09-13 DIAGNOSIS — H269 Unspecified cataract: Secondary | ICD-10-CM | POA: Insufficient documentation

## 2017-09-13 DIAGNOSIS — Z961 Presence of intraocular lens: Secondary | ICD-10-CM | POA: Diagnosis not present

## 2017-09-13 DIAGNOSIS — M810 Age-related osteoporosis without current pathological fracture: Secondary | ICD-10-CM | POA: Insufficient documentation

## 2017-09-13 DIAGNOSIS — H2512 Age-related nuclear cataract, left eye: Secondary | ICD-10-CM | POA: Diagnosis not present

## 2017-09-13 DIAGNOSIS — H25812 Combined forms of age-related cataract, left eye: Secondary | ICD-10-CM | POA: Diagnosis not present

## 2017-09-13 DIAGNOSIS — F0390 Unspecified dementia without behavioral disturbance: Secondary | ICD-10-CM | POA: Insufficient documentation

## 2017-09-13 DIAGNOSIS — Z881 Allergy status to other antibiotic agents status: Secondary | ICD-10-CM | POA: Diagnosis not present

## 2017-09-13 HISTORY — PX: CATARACT EXTRACTION W/PHACO: SHX586

## 2017-09-13 SURGERY — PHACOEMULSIFICATION, CATARACT, WITH IOL INSERTION
Anesthesia: Monitor Anesthesia Care | Site: Eye | Laterality: Left

## 2017-09-13 MED ORDER — FENTANYL CITRATE (PF) 100 MCG/2ML IJ SOLN
25.0000 ug | Freq: Once | INTRAMUSCULAR | Status: AC
  Administered 2017-09-13: 25 ug via INTRAVENOUS

## 2017-09-13 MED ORDER — PHENYLEPHRINE HCL 2.5 % OP SOLN
1.0000 [drp] | OPHTHALMIC | Status: AC
  Administered 2017-09-13 (×3): 1 [drp] via OPHTHALMIC

## 2017-09-13 MED ORDER — MIDAZOLAM HCL 2 MG/2ML IJ SOLN
INTRAMUSCULAR | Status: AC
  Filled 2017-09-13: qty 2

## 2017-09-13 MED ORDER — POVIDONE-IODINE 5 % OP SOLN
OPHTHALMIC | Status: DC | PRN
  Administered 2017-09-13: 1 via OPHTHALMIC

## 2017-09-13 MED ORDER — PROVISC 10 MG/ML IO SOLN
INTRAOCULAR | Status: DC | PRN
Start: 2017-09-13 — End: 2017-09-13
  Administered 2017-09-13: 0.85 mL via INTRAOCULAR

## 2017-09-13 MED ORDER — NEOMYCIN-POLYMYXIN-DEXAMETH 3.5-10000-0.1 OP SUSP
OPHTHALMIC | Status: DC | PRN
  Administered 2017-09-13: 2 [drp] via OPHTHALMIC

## 2017-09-13 MED ORDER — MIDAZOLAM HCL 2 MG/2ML IJ SOLN
1.0000 mg | INTRAMUSCULAR | Status: AC
  Administered 2017-09-13 (×2): 1 mg via INTRAVENOUS

## 2017-09-13 MED ORDER — LACTATED RINGERS IV SOLN
INTRAVENOUS | Status: DC
  Administered 2017-09-13: 08:00:00 via INTRAVENOUS

## 2017-09-13 MED ORDER — EPINEPHRINE PF 1 MG/ML IJ SOLN
INTRAOCULAR | Status: DC | PRN
  Administered 2017-09-13: 500 mL

## 2017-09-13 MED ORDER — CYCLOPENTOLATE-PHENYLEPHRINE 0.2-1 % OP SOLN
1.0000 [drp] | OPHTHALMIC | Status: AC
  Administered 2017-09-13 (×3): 1 [drp] via OPHTHALMIC

## 2017-09-13 MED ORDER — BSS IO SOLN
INTRAOCULAR | Status: DC | PRN
  Administered 2017-09-13: 15 mL

## 2017-09-13 MED ORDER — TETRACAINE HCL 0.5 % OP SOLN
1.0000 [drp] | OPHTHALMIC | Status: AC
  Administered 2017-09-13 (×3): 1 [drp] via OPHTHALMIC

## 2017-09-13 MED ORDER — LIDOCAINE HCL (PF) 1 % IJ SOLN
INTRAOCULAR | Status: DC | PRN
  Administered 2017-09-13: 1 mL

## 2017-09-13 MED ORDER — SODIUM HYALURONATE 23 MG/ML IO SOLN
INTRAOCULAR | Status: DC | PRN
  Administered 2017-09-13: 0.6 mL via INTRAOCULAR

## 2017-09-13 MED ORDER — FENTANYL CITRATE (PF) 100 MCG/2ML IJ SOLN
INTRAMUSCULAR | Status: AC
  Filled 2017-09-13: qty 2

## 2017-09-13 MED ORDER — LIDOCAINE HCL 3.5 % OP GEL
1.0000 "application " | Freq: Once | OPHTHALMIC | Status: AC
  Administered 2017-09-13: 1 via OPHTHALMIC

## 2017-09-13 SURGICAL SUPPLY — 15 items

## 2017-09-13 NOTE — Anesthesia Preprocedure Evaluation (Signed)
Anesthesia Evaluation  Patient identified by MRN, date of birth, ID band Patient awake    Reviewed: Allergy & Precautions, NPO status , Patient's Chart, lab work & pertinent test results  Airway Mallampati: II  TM Distance: >3 FB     Dental  (+) Teeth Intact   Pulmonary neg pulmonary ROS,    breath sounds clear to auscultation       Cardiovascular negative cardio ROS   Rhythm:Regular Rate:Normal     Neuro/Psych PSYCHIATRIC DISORDERS Dementia negative psych ROS   GI/Hepatic negative GI ROS, Neg liver ROS,   Endo/Other  negative endocrine ROS  Renal/GU negative Renal ROS     Musculoskeletal   Abdominal   Peds  Hematology   Anesthesia Other Findings   Reproductive/Obstetrics                             Anesthesia Physical Anesthesia Plan  ASA: II  Anesthesia Plan: MAC   Post-op Pain Management:    Induction:   PONV Risk Score and Plan:   Airway Management Planned: Nasal Cannula  Additional Equipment:   Intra-op Plan:   Post-operative Plan:   Informed Consent: I have reviewed the patients History and Physical, chart, labs and discussed the procedure including the risks, benefits and alternatives for the proposed anesthesia with the patient or authorized representative who has indicated his/her understanding and acceptance.     Plan Discussed with:   Anesthesia Plan Comments:         Anesthesia Quick Evaluation

## 2017-09-13 NOTE — H&P (Signed)
The H and P was reviewed and updated. The patient was examined.  No changes were found after exam.  The surgical eye was marked.  

## 2017-09-13 NOTE — Anesthesia Postprocedure Evaluation (Signed)
Anesthesia Post Note  Patient: Jaclyn Robertson  Procedure(s) Performed: CATARACT EXTRACTION PHACO AND INTRAOCULAR LENS PLACEMENT ([place]) (Left Eye)  Patient location during evaluation: Short Stay Anesthesia Type: MAC Level of consciousness: awake and patient cooperative Pain management: pain level controlled Vital Signs Assessment: post-procedure vital signs reviewed and stable Respiratory status: spontaneous breathing, nonlabored ventilation and respiratory function stable Cardiovascular status: blood pressure returned to baseline Postop Assessment: no apparent nausea or vomiting Anesthetic complications: no     Last Vitals:  Vitals:   09/13/17 0800 09/13/17 0805  BP: (!) 92/54   Resp: 19 19  Temp:    SpO2: 95% 96%    Last Pain:  Vitals:   09/13/17 0703  TempSrc: Oral                 Joy Reiger J

## 2017-09-13 NOTE — Discharge Instructions (Signed)
Please discharge patient when stable, will follow up today with Dr. Guy Toney at the [facility] office immediately following discharge.  Leave shield in place until visit.  All paperwork with discharge instructions will be given at the office. ° ° °PATIENT INSTRUCTIONS °POST-ANESTHESIA ° °IMMEDIATELY FOLLOWING SURGERY:  Do not drive or operate machinery for the first twenty four hours after surgery.  Do not make any important decisions for twenty four hours after surgery or while taking narcotic pain medications or sedatives.  If you develop intractable nausea and vomiting or a severe headache please notify your doctor immediately. ° °FOLLOW-UP:  Please make an appointment with your surgeon as instructed. You do not need to follow up with anesthesia unless specifically instructed to do so. ° °WOUND CARE INSTRUCTIONS (if applicable):  Keep a dry clean dressing on the anesthesia/puncture wound site if there is drainage.  Once the wound has quit draining you may leave it open to air.  Generally you should leave the bandage intact for twenty four hours unless there is drainage.  If the epidural site drains for more than 36-48 hours please call the anesthesia department. ° °QUESTIONS?:  Please feel free to call your physician or the hospital operator if you have any questions, and they will be happy to assist you.    ° ° ° °

## 2017-09-13 NOTE — Transfer of Care (Signed)
Immediate Anesthesia Transfer of Care Note  Patient: Jaclyn Robertson  Procedure(s) Performed: CATARACT EXTRACTION PHACO AND INTRAOCULAR LENS PLACEMENT ([facility]) (Left Eye)  Patient Location: PACU  Anesthesia Type:MAC  Level of Consciousness: awake and patient cooperative  Airway & Oxygen Therapy: Patient Spontanous Breathing  Post-op Assessment: Report given to RN, Post -op Vital signs reviewed and stable and Patient moving all extremities  Post vital signs: Reviewed and stable  Last Vitals:  Vitals:   09/13/17 0800 09/13/17 0805  BP: (!) 92/54   Resp: 19 19  Temp:    SpO2: 95% 96%    Last Pain:  Vitals:   09/13/17 0703  TempSrc: Oral         Complications: No apparent anesthesia complications

## 2017-09-13 NOTE — Op Note (Signed)
Date of procedure: 09/13/17  Pre-operative diagnosis: Visually significant cataract, Left Eye  Post-operative diagnosis: Visually significant cataract, Left Eye  Procedure: Removal of cataract via phacoemulsification and insertion of intra-ocular lens [place] and [place]  +21.0D into the capsular bag of the Left Eye  Attending surgeon: Gerda Diss. Channelle Bottger, MD, MA  Anesthesia: MAC, Topical Akten  Complications: None  Estimated Blood Loss: <7m (minimal)  Specimens: None  Implants: As above  Indications:  Visually significant cataract, Left Eye  Procedure:  The patient was seen and identified in the pre-operative area. The operative eye was identified and dilated.  The operative eye was marked.  Topical anesthesia was administered to the operative eye.     The patient was then to the operative suite and placed in the supine position.  A timeout was performed confirming the patient, procedure to be performed, and all other relevant information.   The patient's face was prepped and draped in the usual fashion for intra-ocular surgery.  A lid speculum was placed into the operative eye and the surgical microscope moved into place and focused.  An inferotemporal paracentesis was created using a 20 gauge paracentesis blade.  Shugarcaine was injected into the anterior chamber.  Viscoelastic was injected into the anterior chamber.  A temporal clear-corneal main wound incision was created using a 2.4105mmicrokeratome.  A continuous curvilinear capsulorrhexis was initiated using an irrigating cystitome and completed using capsulorrhexis forceps.  Hydrodissection and hydrodeliniation were performed.  Viscoelastic was injected into the anterior chamber.  A phacoemulsification handpiece and a chopper as a second instrument were used to remove the nucleus and epinucleus. The irrigation/aspiration handpiece was used to remove any remaining cortical material.   The capsular bag was reinflated with  viscoelastic, checked, and found to be intact.  The intraocular lens was inserted into the capsular bag and dialed into place using a Kuglen hook.  The irrigation/aspiration handpiece was used to remove any remaining viscoelastic.  The clear corneal wound and paracentesis wounds were then hydrated and checked with Weck-Cels to be watertight.  The lid-speculum and drape was removed, and the patient's face was cleaned with a wet and dry 4x4.  Maxitrol was instilled in the eye before a clear shield was taped over the eye. The patient was taken to the post-operative care unit in good condition, having tolerated the procedure well.  Post-Op Instructions: The patient will follow up at [facility] a same day post-operative evaluation and will receive all other orders and instructions.

## 2017-09-16 ENCOUNTER — Encounter (HOSPITAL_COMMUNITY): Payer: Self-pay | Admitting: Ophthalmology

## 2017-11-08 DIAGNOSIS — H25811 Combined forms of age-related cataract, right eye: Secondary | ICD-10-CM | POA: Diagnosis not present

## 2017-11-11 ENCOUNTER — Encounter (HOSPITAL_COMMUNITY)
Admission: RE | Admit: 2017-11-11 | Discharge: 2017-11-11 | Disposition: A | Discharge status: Home / Self Care | Payer: PPO | Source: Ambulatory Visit | Attending: Ophthalmology | Admitting: Ophthalmology

## 2017-11-12 ENCOUNTER — Encounter (HOSPITAL_COMMUNITY): Payer: Self-pay

## 2017-11-15 ENCOUNTER — Ambulatory Visit (HOSPITAL_COMMUNITY): Payer: PPO | Admitting: Anesthesiology

## 2017-11-15 ENCOUNTER — Encounter (HOSPITAL_COMMUNITY)
Admission: RE | Disposition: A | Discharge status: Home / Self Care | Payer: Self-pay | Source: Ambulatory Visit | Attending: Ophthalmology

## 2017-11-15 ENCOUNTER — Ambulatory Visit (HOSPITAL_COMMUNITY)
Admission: RE | Admit: 2017-11-15 | Discharge: 2017-11-15 | Disposition: A | Discharge status: Home / Self Care | Payer: PPO | Source: Ambulatory Visit | Attending: Ophthalmology | Admitting: Ophthalmology

## 2017-11-15 ENCOUNTER — Encounter (HOSPITAL_COMMUNITY): Payer: Self-pay | Admitting: *Deleted

## 2017-11-15 DIAGNOSIS — H25811 Combined forms of age-related cataract, right eye: Secondary | ICD-10-CM | POA: Diagnosis not present

## 2017-11-15 DIAGNOSIS — H2511 Age-related nuclear cataract, right eye: Secondary | ICD-10-CM | POA: Insufficient documentation

## 2017-11-15 DIAGNOSIS — Z961 Presence of intraocular lens: Secondary | ICD-10-CM | POA: Diagnosis not present

## 2017-11-15 HISTORY — PX: CATARACT EXTRACTION W/PHACO: SHX586

## 2017-11-15 SURGERY — PHACOEMULSIFICATION, CATARACT, WITH IOL INSERTION
Anesthesia: General | Site: Eye | Laterality: Right

## 2017-11-15 MED ORDER — LIDOCAINE HCL (PF) 1 % IJ SOLN
INTRAOCULAR | Status: DC | PRN
  Administered 2017-11-15: 1 mL via OPHTHALMIC

## 2017-11-15 MED ORDER — TETRACAINE HCL 0.5 % OP SOLN
1.0000 [drp] | OPHTHALMIC | Status: AC
  Administered 2017-11-15 (×3): 1 [drp] via OPHTHALMIC

## 2017-11-15 MED ORDER — PROVISC 10 MG/ML IO SOLN
INTRAOCULAR | Status: DC | PRN
  Administered 2017-11-15: 0.85 mL via INTRAOCULAR

## 2017-11-15 MED ORDER — LIDOCAINE HCL 3.5 % OP GEL
1.0000 "application " | Freq: Once | OPHTHALMIC | Status: AC
  Administered 2017-11-15: 1 via OPHTHALMIC

## 2017-11-15 MED ORDER — NEOMYCIN-POLYMYXIN-DEXAMETH 3.5-10000-0.1 OP SUSP
OPHTHALMIC | Status: DC | PRN
  Administered 2017-11-15: 2 [drp] via OPHTHALMIC

## 2017-11-15 MED ORDER — POVIDONE-IODINE 5 % OP SOLN
OPHTHALMIC | Status: DC | PRN
  Administered 2017-11-15: 1 via OPHTHALMIC

## 2017-11-15 MED ORDER — LACTATED RINGERS IV SOLN
INTRAVENOUS | Status: DC
  Administered 2017-11-15: 08:00:00 via INTRAVENOUS

## 2017-11-15 MED ORDER — CYCLOPENTOLATE-PHENYLEPHRINE 0.2-1 % OP SOLN
1.0000 [drp] | OPHTHALMIC | Status: AC
  Administered 2017-11-15 (×3): 1 [drp] via OPHTHALMIC

## 2017-11-15 MED ORDER — PHENYLEPHRINE HCL 2.5 % OP SOLN
1.0000 [drp] | OPHTHALMIC | Status: AC
  Administered 2017-11-15 (×3): 1 [drp] via OPHTHALMIC

## 2017-11-15 MED ORDER — EPINEPHRINE PF 1 MG/ML IJ SOLN
INTRAMUSCULAR | Status: DC | PRN
  Administered 2017-11-15: 500 mL

## 2017-11-15 MED ORDER — SODIUM HYALURONATE 23 MG/ML IO SOLN
INTRAOCULAR | Status: DC | PRN
  Administered 2017-11-15: 0.6 mL via INTRAOCULAR

## 2017-11-15 MED ORDER — BSS IO SOLN
INTRAOCULAR | Status: DC | PRN
  Administered 2017-11-15: 15 mL

## 2017-11-15 MED ORDER — MIDAZOLAM HCL 2 MG/2ML IJ SOLN
INTRAMUSCULAR | Status: AC
  Filled 2017-11-15: qty 2

## 2017-11-15 SURGICAL SUPPLY — 15 items
CLOTH BEACON ORANGE TIMEOUT ST (SAFETY) ×2 IMPLANT
EYE SHIELD UNIVERSAL CLEAR (GAUZE/BANDAGES/DRESSINGS) ×2 IMPLANT
GLOVE BIOGEL PI IND STRL 6.5 (GLOVE) IMPLANT
GLOVE BIOGEL PI INDICATOR 6.5 (GLOVE) ×2
GLOVE SS BIOGEL STRL SZ 6.5 (GLOVE) IMPLANT
GLOVE SUPERSENSE BIOGEL SZ 6.5 (GLOVE) ×2
LENS ALC ACRYL/TECN (Ophthalmic Related) ×2 IMPLANT
NDL HYPO 18GX1.5 BLUNT FILL (NEEDLE) IMPLANT
NEEDLE HYPO 18GX1.5 BLUNT FILL (NEEDLE) ×3 IMPLANT
PAD ARMBOARD 7.5X6 YLW CONV (MISCELLANEOUS) ×2 IMPLANT
SYR TB 1ML LL NO SAFETY (SYRINGE) ×2 IMPLANT
TAPE SURG TRANSPORE 1 IN (GAUZE/BANDAGES/DRESSINGS) IMPLANT
TAPE SURGICAL TRANSPORE 1 IN (GAUZE/BANDAGES/DRESSINGS) ×2
VISCOELASTIC ADDITIONAL (OPHTHALMIC RELATED) ×2 IMPLANT
WATER STERILE IRR 250ML POUR (IV SOLUTION) ×2 IMPLANT

## 2017-11-15 NOTE — Anesthesia Preprocedure Evaluation (Addendum)
Anesthesia Evaluation  Patient identified by MRN, date of birth, ID band Patient awake    Reviewed: Allergy & Precautions, H&P , NPO status , Patient's Chart, lab work & pertinent test results, reviewed documented beta blocker date and time   Airway Mallampati: III  TM Distance: >3 FB Neck ROM: full    Dental no notable dental hx.    Pulmonary neg pulmonary ROS,    Pulmonary exam normal breath sounds clear to auscultation       Cardiovascular Exercise Tolerance: Good negative cardio ROS   Rhythm:regular Rate:Normal     Neuro/Psych negative neurological ROS     GI/Hepatic negative GI ROS, Neg liver ROS,   Endo/Other  negative endocrine ROS  Renal/GU negative Renal ROS  negative genitourinary   Musculoskeletal   Abdominal   Peds  Hematology negative hematology ROS (+)   Anesthesia Other Findings No clinical complaints EKG 2/19... NSR with no acute changes  Reproductive/Obstetrics negative OB ROS                             Anesthesia Physical Anesthesia Plan  ASA: II  Anesthesia Plan: MAC   Post-op Pain Management:    Induction:   PONV Risk Score and Plan:   Airway Management Planned:   Additional Equipment:   Intra-op Plan:   Post-operative Plan:   Informed Consent: I have reviewed the patients History and Physical, chart, labs and discussed the procedure including the risks, benefits and alternatives for the proposed anesthesia with the patient or authorized representative who has indicated his/her understanding and acceptance.   Dental Advisory Given  Plan Discussed with: CRNA  Anesthesia Plan Comments:        Anesthesia Quick Evaluation

## 2017-11-15 NOTE — Discharge Instructions (Signed)
Please discharge patient when stable, will follow up today with Dr. Khair Chasteen at the [facility] office immediately following discharge.  Leave shield in place until visit.  All paperwork with discharge instructions will be given at the office. ° ° °Monitored Anesthesia Care, Care After °These instructions provide you with information about caring for yourself after your procedure. Your health care provider may also give you more specific instructions. Your treatment has been planned according to current medical practices, but problems sometimes occur. Call your health care provider if you have any problems or questions after your procedure. °What can I expect after the procedure? °After your procedure, it is common to: °· Feel sleepy for several hours. °· Feel clumsy and have poor balance for several hours. °· Feel forgetful about what happened after the procedure. °· Have poor judgment for several hours. °· Feel nauseous or vomit. °· Have a sore throat if you had a breathing tube during the procedure. ° °Follow these instructions at home: °For at least 24 hours after the procedure: ° °· Do not: °? Participate in activities in which you could fall or become injured. °? Drive. °? Use heavy machinery. °? Drink alcohol. °? Take sleeping pills or medicines that cause drowsiness. °? Make important decisions or sign legal documents. °? Take care of children on your own. °· Rest. °Eating and drinking °· Follow the diet that is recommended by your health care provider. °· If you vomit, drink water, juice, or soup when you can drink without vomiting. °· Make sure you have little or no nausea before eating solid foods. °General instructions °· Have a responsible adult stay with you until you are awake and alert. °· Take over-the-counter and prescription medicines only as told by your health care provider. °· If you smoke, do not smoke without supervision. °· Keep all follow-up visits as told by your health care  provider. This is important. °Contact a health care provider if: °· You keep feeling nauseous or you keep vomiting. °· You feel light-headed. °· You develop a rash. °· You have a fever. °Get help right away if: °· You have trouble breathing. °This information is not intended to replace advice given to you by your health care provider. Make sure you discuss any questions you have with your health care provider. °Document Released: 10/23/2015 Document Revised: 02/22/2016 Document Reviewed: 10/23/2015 °Elsevier Interactive Patient Education © 2018 [employer] ° °

## 2017-11-15 NOTE — Transfer of Care (Signed)
Immediate Anesthesia Transfer of Care Note  Patient: Jaclyn Robertson  Procedure(s) Performed: CATARACT EXTRACTION PHACO AND INTRAOCULAR LENS PLACEMENT ([facility]) (Right Eye)  Patient Location: Short Stay  Anesthesia Type:MAC  Level of Consciousness: awake  Airway & Oxygen Therapy: Patient Spontanous Breathing  Post-op Assessment: Report given to RN  Post vital signs: Reviewed and stable  Last Vitals:  Vitals Value Taken Time  BP    Temp    Pulse    Resp    SpO2      Last Pain:  Vitals:   11/15/17 0743  TempSrc: Oral  PainSc: 0-No pain         Complications: No apparent anesthesia complications

## 2017-11-15 NOTE — Op Note (Signed)
Date of procedure: 11/15/17  Pre-operative diagnosis: Visually significant cataract, Right Eye (H25.?1)  Post-operative diagnosis: Visually significant cataract, Right Eye  Procedure: Removal of cataract via phacoemulsification and insertion of intra-ocular lens Wynetta Emery and [place]  +19.5D into the capsular bag of the Right Eye  Attending surgeon: Gerda Diss. Ireoluwa Grant, MD, MA  Anesthesia: MAC, Topical Akten  Complications: None  Estimated Blood Loss: <72m (minimal)  Specimens: None  Implants: As above  Indications:  Visually significant cataract, Right Eye  Procedure:  The patient was seen and identified in the pre-operative area. The operative eye was identified and dilated.  The operative eye was marked.  Topical anesthesia was administered to the operative eye.     The patient was then to the operative suite and placed in the supine position.  A timeout was performed confirming the patient, procedure to be performed, and all other relevant information.   The patient's face was prepped and draped in the usual fashion for intra-ocular surgery.  A lid speculum was placed into the operative eye and the surgical microscope moved into place and focused.  A superotemporal paracentesis was created using a 20 gauge paracentesis blade.  Shugarcaine was injected into the anterior chamber.  Viscoelastic was injected into the anterior chamber.  A temporal clear-corneal main wound incision was created using a 2.463mmicrokeratome.  A continuous curvilinear capsulorrhexis was initiated using an irrigating cystitome and completed using capsulorrhexis forceps.  Hydrodissection and hydrodeliniation were performed.  Viscoelastic was injected into the anterior chamber.  A phacoemulsification handpiece and a chopper as a second instrument were used to remove the nucleus and epinucleus. The irrigation/aspiration handpiece was used to remove any remaining cortical material.   The capsular bag was  reinflated with viscoelastic, checked, and found to be intact.  The intraocular lens was inserted into the capsular bag and dialed into place using a Kuglen hook.  The irrigation/aspiration handpiece was used to remove any remaining viscoelastic.  The clear corneal wound and paracentesis wounds were then hydrated and checked with Weck-Cels to be watertight.  The lid-speculum and drape was removed, and the patient's face was cleaned with a wet and dry 4x4.  Maxitrol was instilled in the eye before a clear shield was taped over the eye. The patient was taken to the post-operative care unit in good condition, having tolerated the procedure well.  Post-Op Instructions: [facility] and will receive all other orders and instructions.

## 2017-11-15 NOTE — H&P (Signed)
The H and P was reviewed and updated. The patient was examined.  No changes were found after exam.  The surgical eye was marked.  

## 2017-11-15 NOTE — Anesthesia Postprocedure Evaluation (Signed)
Anesthesia Post Note  Patient: Jaclyn Robertson  Procedure(s) Performed: CATARACT EXTRACTION PHACO AND INTRAOCULAR LENS PLACEMENT ([place]) (Right Eye)  Patient location during evaluation: Short Stay Anesthesia Type: MAC Level of consciousness: awake and alert and oriented Pain management: pain level controlled Vital Signs Assessment: post-procedure vital signs reviewed and stable Respiratory status: spontaneous breathing Cardiovascular status: blood pressure returned to baseline and stable Postop Assessment: no apparent nausea or vomiting Anesthetic complications: no     Last Vitals:  Vitals:   11/15/17 0820 11/15/17 0825  BP:    Pulse:    Resp: 19 20  Temp:    SpO2: 100% 100%    Last Pain:  Vitals:   11/15/17 0743  TempSrc: Oral  PainSc: 0-No pain                 Andrews Tener

## 2017-11-18 ENCOUNTER — Other Ambulatory Visit: Payer: Self-pay

## 2017-11-18 ENCOUNTER — Ambulatory Visit (INDEPENDENT_AMBULATORY_CARE_PROVIDER_SITE_OTHER): Payer: PPO | Admitting: Neurology

## 2017-11-18 ENCOUNTER — Encounter (HOSPITAL_COMMUNITY): Payer: Self-pay | Admitting: Ophthalmology

## 2017-11-18 VITALS — BP 96/62 | HR 62 | Ht 65.5 in | Wt 114.0 lb

## 2017-11-18 DIAGNOSIS — F0280 Dementia in other diseases classified elsewhere without behavioral disturbance: Secondary | ICD-10-CM

## 2017-11-18 DIAGNOSIS — G301 Alzheimer's disease with late onset: Secondary | ICD-10-CM

## 2017-11-18 NOTE — Patient Instructions (Signed)
Continue Donepezil  daily and Memantine  twice a day. Continue to monitor driving. Follow-up in 6 months, call for any changes  FALL PRECAUTIONS: Be cautious when walking. Scan the area for obstacles that may increase the risk of trips and falls. When getting up in the mornings, sit up at the edge of the bed for a few minutes before getting out of bed. Consider elevating the bed at the head end to avoid drop of blood pressure when getting up. Walk always in a well-lit room (use night lights in the walls). Avoid area rugs or power cords from appliances in the middle of the walkways. Use a walker or a cane if necessary and consider physical therapy for balance exercise. Get your eyesight checked regularly.  FINANCIAL OVERSIGHT: Supervision, especially oversight when making financial decisions or transactions is also recommended.  HOME SAFETY: Consider the safety of the kitchen when operating appliances like stoves, microwave oven, and blender. Consider having supervision and share cooking responsibilities until no longer able to participate in those. Accidents with firearms and other hazards in the house should be identified and addressed as well.  DRIVING: Regarding driving, in patients with progressive memory problems, driving will be impaired. We advise to have someone else do the driving if trouble finding directions or if minor accidents are reported. Independent driving assessment is available to determine safety of driving.  ABILITY TO BE LEFT ALONE: If patient is unable to contact 911 operator, consider using LifeLine, or when the need is there, arrange for someone to stay with patients. Smoking is a fire hazard, consider supervision or cessation. Risk of wandering should be assessed by caregiver and if detected at any point, supervision and safe proof recommendations should be instituted.  MEDICATION SUPERVISION: Inability to self-administer medication needs to be constantly addressed.  Implement a mechanism to ensure safe administration of the medications.  RECOMMENDATIONS FOR ALL PATIENTS WITH MEMORY PROBLEMS: 1. Continue to exercise (Recommend 30 minutes of walking everyday, or 3 hours every week) 2. Increase social interactions - continue going to [place] and enjoy social gatherings with friends and family 3. Eat healthy, avoid fried foods and eat more fruits and vegetables 4. Maintain adequate blood pressure, blood sugar, and blood cholesterol level. Reducing the risk of stroke and cardiovascular disease also helps promoting better memory. 5. Avoid stressful situations. Live a simple life and avoid aggravations. Organize your time and prepare for the next day in anticipation. 6. Sleep well, avoid any interruptions of sleep and avoid any distractions in the bedroom that may interfere with adequate sleep quality 7. Avoid sugar, avoid sweets as there is a strong link between excessive sugar intake, diabetes, and cognitive impairment The Mediterranean diet has been shown to help patients reduce the risk of progressive memory disorders and reduces cardiovascular risk. This includes eating fish, eat fruits and green leafy vegetables, nuts like almonds and hazelnuts, walnuts, and also use olive oil. Avoid fast foods and fried foods as much as possible. Avoid sweets and sugar as sugar use has been linked to worsening of memory function.  There is always a concern of gradual progression of memory problems. If this is the case, then we may need to adjust level of care according to patient needs. Support, both to the patient and caregiver, should then be put into place.

## 2017-11-18 NOTE — Progress Notes (Signed)
NEUROLOGY FOLLOW UP OFFICE NOTE  Jaclyn Robertson [id number] Oct 03, 1934  HISTORY OF PRESENT ILLNESS: I had the pleasure of seeing Jaclyn Robertson in follow-up in the neurology clinic on 11/18/2017.  The patient was last seen 6 months ago for worsening memory. She is again accompanied by her husband who helps supplement the history today.  Records and images were personally reviewed where available. She underwent Neuropsychological testing in January 2019 with results indicating mild dementia, most likely due to Alzheimer's disease. Her cognitive profile was very concerning for medial temporal lobe involvement. MRI brain without contrast done 09/25/16 did not show any acute changes, there was moderate diffuse atrophy, mild chronic microvascular disease. She was taking Aricept, Namenda was added by her PCP. She is taking Aricept  daily and Namenda  BID without side effects. Her husband reports she has difficulties remembering appointments, including doctor and hair appointments, as well as exercise classes. 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 She has a calendar but still forgets. She drives short distances without getting lost. Her husband is in charge of finances. She denies missing medications, her husband checks behind her. No personality changes. She is able to dress and bathe independently. She denies any headaches, dizziness, diplopia, dysarthria, dysphagia, neck/back pain, focal numbness/tingling/weakness. No falls.   HPI 08/22/2016: This is a 83 yo LH woman with worsening memory. Her husband stated noticing changes a couple of years ago, she would sometimes ask the same questions repeatedly. She talked to her prior PCP about this last fall, she would mostly have difficulty remembering names, schedules. She would get lost driving, forgetting she was supposed to turn. This has happened more in unfamiliar roads, but  sometimes she has missed a turn going home. She denies any missed bill payments, no missed medications. She continues to cook without leaving the stove on. She misplaces things frequently. She has been told she repeats herself, but reports that hearing is also a problem. She has noticed more word-finding difficulties in the past few months. 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 She has been taking Aricept  daily and has had "wild crazy dreams" at night. She states Xanax was started for the dreams. She denies any anxiety but her husband states she gets upset at herself easily. No paranoia or hallucinations. She denies any significant head injuries. No family history of dementia. She drinks 1-2 glasses of wine every night.   PAST MEDICAL HISTORY: Past Medical History:  Diagnosis Date  . Dementia     MEDICATIONS:  Outpatient Encounter Medications as of 11/18/2017  Medication Sig  . Diphenhyd-Benzeth-Menth-Zn Ace (CALAGEL MAXIMUM STRENGTH) GEL Apply 1 application topically daily as needed (itching/pain).  Marland Kitchen donepezil (ARICEPT) 10 MG tablet Take 10 mg by mouth at bedtime  . ibuprofen (ADVIL,MOTRIN) 200 MG tablet Take 200 mg by mouth daily as needed for headache or moderate pain.  . Melatonin 3 MG TABS Take 6 mg by mouth at bedtime.  . memantine (NAMENDA) 10 MG tablet Take 10 mg by mouth 2 (two) times daily.  . Multiple Vitamins-Minerals (CENTRUM SILVER ULTRA WOMENS) TABS Take 1 tablet by mouth daily.   Bertram Gala Glycol-Propyl Glycol (SYSTANE ULTRA OP) Place 1 drop into  both eyes daily as needed (dry eyes).  . raloxifene (EVISTA) 60 MG tablet Take 60 mg by mouth once daily   No facility-administered encounter medications on file as of 11/18/2017.     ALLERGIES: Allergies  Allergen Reactions   . Levaquin  [Levofloxacin In D5w] Rash    FAMILY HISTORY: Family History  Problem Relation Age of Onset  . Seizures Mother     SOCIAL HISTORY: Social History   Socioeconomic History  . Marital status: Married    Spouse name: Not on file  . Number of children: Not on file  . Years of education: Not on file  . Highest education level: Not on file  Occupational History  . Not on file  Social Needs  . Financial resource strain: Not on file  . Food insecurity:    Worry: Not on file    Inability: Not on file  . Transportation needs:    Medical: Not on file    Non-medical: Not on file  Tobacco Use  . Smoking status: Never Smoker  . Smokeless tobacco: Never Used  Substance and Sexual Activity  . Alcohol use: Yes    Alcohol/week: 1.2 oz    Types: 2 Glasses of wine per week    Comment: Occ.  . Drug use: No  . Sexual activity: Not Currently    Birth control/protection: Post-menopausal  Lifestyle  . Physical activity:    Days per week: Not on file    Minutes per session: Not on file  . Stress: Not on file  Relationships  . Social connections:    Talks on phone: Not on file    Gets together: Not on file    Attends religious service: Not on file    Active member of club or organization: Not on file    Attends meetings of clubs or organizations: Not on file    Relationship status: Not on file  . Intimate partner violence:    Fear of current or ex partner: Not on file    Emotionally abused: Not on file    Physically abused: Not on file    Forced sexual activity: Not on file  Other Topics Concern  . Not on file  Social History Narrative  . Not on file    REVIEW OF SYSTEMS: Constitutional: No fevers, chills, or sweats, no generalized fatigue, change in appetite Eyes: No visual changes, double vision, eye pain Ear, nose and throat: No hearing loss, ear pain, nasal congestion, sore throat Cardiovascular: No chest pain, palpitations Respiratory:  No shortness of  breath at rest or with exertion, wheezes GastrointestinaI: No nausea, vomiting, diarrhea, abdominal pain, fecal incontinence Genitourinary:  No dysuria, urinary retention or frequency Musculoskeletal:  No neck pain, back pain Integumentary: No rash, pruritus, skin lesions Neurological: as above Psychiatric: No depression, insomnia, anxiety Endocrine: No palpitations, fatigue, diaphoresis, mood swings, change in appetite, change in weight, increased thirst Hematologic/Lymphatic:  No anemia, purpura, petechiae. Allergic/Immunologic: no itchy/runny eyes, nasal congestion, recent allergic reactions, rashes  PHYSICAL EXAM: Vitals:   11/18/17 1450  BP: 96/62  Pulse: 62  SpO2: 98%   General: No acute distress Head:  Normocephalic/atraumatic Neck: supple, no paraspinal tenderness, full range of motion Heart:  Regular rate and rhythm Lungs:  Clear to auscultation bilaterally Back: No paraspinal tenderness Skin/Extremities: No rash, no edema Neurological Exam: alert and oriented to person, place, and time. No aphasia or dysarthria. Fund of knowledge is appropriate.  Recent and remote memory are impaired.  Attention and concentration  are normal.    Able to name objects and repeat phrases. Cranial nerves: Pupils equal, round.  Extraocular movements intact with no nystagmus. No facial asymmetry. Motor: 5/5 throughout with no pronator drift. No incoordination on finger to nose testing. Gait narrow-based and steady. No ataxia.  IMPRESSION: This is a 83 yo LH woman with worsening memory, recent Neuropsychological testing indicated mild dementia likely due to Alzheimer's disease. The diagnosis was discussed again with the patient and her husband, including recommendations. She is already taking Donepezil  daily and Memantine  BID without side effects, we discussed expectations from the medications. We discussed the diagnosis and prognosis, including monitoring driving. She is now off the Xanax and  takes melatonin for sleep. She has her hearing aids fixed, which has helped. Husband monitors medications, continue supervision. They are currently comfortable with current home situation, home safety was discussed. We discussed the importance of control of vascular risk factors, physical exercise, and brain stimulation exercises for brain health. We discussed looking into local support groups and educational meetings, day programs. She will follow-up in 6 months and knows to call for any changes.   Thank you for allowing me to participate in her care.  Please do not hesitate to call for any questions or concerns.  The duration of this appointment visit was 30 minutes of face-to-face time with the patient.  Greater than 50% of this time was spent in counseling, explanation of diagnosis, planning of further management, and coordination of care.   Jaclyn Robertson, M.D.   CC: Dr. Lysbeth Robertson

## 2018-02-03 ENCOUNTER — Telehealth: Payer: Self-pay | Admitting: Neurology

## 2018-02-03 NOTE — Telephone Encounter (Signed)
I spoke with Mr. Alona BeneJoyce and he said that his wife is declining very rapidly.  He was wondering if her medication needs to be increased.

## 2018-02-03 NOTE — Telephone Encounter (Signed)
Patient husband wants to speak to someone about his wife and her progression

## 2018-02-03 NOTE — Telephone Encounter (Signed)
Pls let husband know she is on full dose of medications for dementia. The medications can help slow down the progression but it does not stop or reverse it. When there is rapid decline, we worry more about an underlying infection such as a urinary tract infection causing worsening of symptoms. Let's do bloodwork CBC, CMP, and check urinalysis, thanks.

## 2018-02-03 NOTE — Telephone Encounter (Signed)
Left message for patient's husband to call back.  

## 2018-02-04 ENCOUNTER — Other Ambulatory Visit: Payer: Self-pay | Admitting: *Deleted

## 2018-02-04 DIAGNOSIS — R413 Other amnesia: Secondary | ICD-10-CM

## 2018-02-04 DIAGNOSIS — G3184 Mild cognitive impairment, so stated: Secondary | ICD-10-CM

## 2018-02-04 NOTE — Telephone Encounter (Signed)
I spoke with patient's husband and he will try to bring her in tomorrow for lab work.

## 2018-02-13 ENCOUNTER — Other Ambulatory Visit (INDEPENDENT_AMBULATORY_CARE_PROVIDER_SITE_OTHER): Payer: PPO

## 2018-02-13 DIAGNOSIS — G3184 Mild cognitive impairment, so stated: Secondary | ICD-10-CM

## 2018-02-13 DIAGNOSIS — R413 Other amnesia: Secondary | ICD-10-CM | POA: Diagnosis not present

## 2018-02-13 LAB — COMPREHENSIVE METABOLIC PANEL
ALK PHOS: 63 U/L (ref 39–117)
ALT: 16 U/L (ref 0–35)
AST: 17 U/L (ref 0–37)
Albumin: 4.1 g/dL (ref 3.5–5.2)
BILIRUBIN TOTAL: 0.5 mg/dL (ref 0.2–1.2)
BUN: 18 mg/dL (ref 6–23)
CO2: 29 mEq/L (ref 19–32)
Calcium: 9.2 mg/dL (ref 8.4–10.5)
Chloride: 105 mEq/L (ref 96–112)
Creatinine, Ser: 0.86 mg/dL (ref 0.40–1.20)
GFR: 67.38 mL/min (ref >60.00)
GLUCOSE: 121 mg/dL — AB (ref 70–99)
Potassium: 4 mEq/L (ref 3.5–5.1)
SODIUM: 141 meq/L (ref 135–145)
TOTAL PROTEIN: 6.7 g/dL (ref 6.0–8.3)

## 2018-02-13 LAB — CBC
HCT: 39.2 % (ref 36.0–46.0)
Hemoglobin: 13.1 g/dL (ref 12.0–15.0)
MCHC: 33.5 g/dL (ref 30.0–36.0)
MCV: 95.5 fl (ref 78.0–100.0)
Platelets: 300 10*3/uL (ref 150.0–400.0)
RBC: 4.11 Mil/uL (ref 3.87–5.11)
RDW: 13.8 % (ref 11.5–15.5)
WBC: 6.7 10*3/uL (ref 4.0–10.5)

## 2018-02-13 LAB — URINALYSIS
Bilirubin Urine: NEGATIVE
Ketones, ur: NEGATIVE
Leukocytes, UA: NEGATIVE
NITRITE: NEGATIVE
Specific Gravity, Urine: 1.02 (ref 1.000–1.030)
Total Protein, Urine: NEGATIVE
UROBILINOGEN UA: 0.2 (ref 0.0–1.0)
Urine Glucose: NEGATIVE
pH: 5.5 (ref 5.0–8.0)

## 2018-02-14 ENCOUNTER — Telehealth: Payer: Self-pay

## 2018-02-14 NOTE — Telephone Encounter (Signed)
-----   Message from Van ClinesKaren M Aquino, MD sent at 02/14/2018  9:21 AM EDT ----- Pls let husband know her labwork is normal. Is she getting good sleep at night? Is there any other trigger that could potentially be contributing, such as increased stress, anxiety, or depression? Thanks

## 2018-02-14 NOTE — Telephone Encounter (Signed)
Spoke with pt and husband relaying message below.  Pt states that her sleep is "iffy" mainly due to having to get up in the middle of the night to urinate.  States that she attempts to take care of that prior to going to bed, however it does not always help.  When asked about other potential triggers, husband states that he believes pt may be experiencing depression from time to time.

## 2018-06-10 ENCOUNTER — Ambulatory Visit (INDEPENDENT_AMBULATORY_CARE_PROVIDER_SITE_OTHER): Payer: PPO | Admitting: Neurology

## 2018-06-10 ENCOUNTER — Other Ambulatory Visit: Payer: Self-pay

## 2018-06-10 ENCOUNTER — Encounter: Payer: Self-pay | Admitting: Neurology

## 2018-06-10 VITALS — BP 126/64 | HR 57 | Ht 65.0 in | Wt 115.0 lb

## 2018-06-10 DIAGNOSIS — G301 Alzheimer's disease with late onset: Secondary | ICD-10-CM | POA: Diagnosis not present

## 2018-06-10 DIAGNOSIS — F0280 Dementia in other diseases classified elsewhere without behavioral disturbance: Secondary | ICD-10-CM | POA: Diagnosis not present

## 2018-06-10 MED ORDER — DONEPEZIL HCL 10 MG PO TABS: ORAL_TABLET | ORAL | 3 refills | Status: DC

## 2018-06-10 MED ORDER — MEMANTINE HCL 10 MG PO TABS: 10.0000 mg | ORAL_TABLET | Freq: Two times a day (BID) | ORAL | 3 refills | Status: DC

## 2018-06-10 MED ORDER — ESCITALOPRAM OXALATE 10 MG PO TABS: 10.0000 mg | ORAL_TABLET | Freq: Every day | ORAL | 3 refills | Status: DC

## 2018-06-10 NOTE — Patient Instructions (Addendum)
1. Start taking the Donepezil 10mg  every morning instead of in the evening 2. Continue Memantine 10mg  twice a day 3. Start Lexapro 10mg  daily 4. Continue to monitor driving 5. Follow-up in 6 months, call for any changes  FALL PRECAUTIONS: Be cautious when walking. Scan the area for obstacles that may increase the risk of trips and falls. When getting up in the mornings, sit up at the edge of the bed for a few minutes before getting out of bed. Consider elevating the bed at the head end to avoid drop of blood pressure when getting up. Walk always in a well-lit room (use night lights in the walls). Avoid area rugs or power cords from appliances in the middle of the walkways. Use a walker or a cane if necessary and consider physical therapy for balance exercise. Get your eyesight checked regularly.  FINANCIAL OVERSIGHT: Supervision, especially oversight when making financial decisions or transactions is also recommended.  HOME SAFETY: Consider the safety of the kitchen when operating appliances like stoves, microwave oven, and blender. Consider having supervision and share cooking responsibilities until no longer able to participate in those. Accidents with firearms and other hazards in the house should be identified and addressed as well.  DRIVING: Regarding driving, in patients with progressive memory problems, driving will be impaired. We advise to have someone else do the driving if trouble finding directions or if minor accidents are reported. Independent driving assessment is available to determine safety of driving.  ABILITY TO BE LEFT ALONE: If patient is unable to contact 911 operator, consider using LifeLine, or when the need is there, arrange for someone to stay with patients. Smoking is a fire hazard, consider supervision or cessation. Risk of wandering should be assessed by caregiver and if detected at any point, supervision and safe proof recommendations should be instituted.  MEDICATION  SUPERVISION: Inability to self-administer medication needs to be constantly addressed. Implement a mechanism to ensure safe administration of the medications.  RECOMMENDATIONS FOR ALL PATIENTS WITH MEMORY PROBLEMS: 1. Continue to exercise (Recommend 30 minutes of walking everyday, or 3 hours every week) 2. Increase social interactions - continue going to [place] and enjoy social gatherings with friends and family 3. Eat healthy, avoid fried foods and eat more fruits and vegetables 4. Maintain adequate blood pressure, blood sugar, and blood cholesterol level. Reducing the risk of stroke and cardiovascular disease also helps promoting better memory. 5. Avoid stressful situations. Live a simple life and avoid aggravations. Organize your time and prepare for the next day in anticipation. 6. Sleep well, avoid any interruptions of sleep and avoid any distractions in the bedroom that may interfere with adequate sleep quality 7. Avoid sugar, avoid sweets as there is a strong link between excessive sugar intake, diabetes, and cognitive impairment The Mediterranean diet has been shown to help patients reduce the risk of progressive memory disorders and reduces cardiovascular risk. This includes eating fish, eat fruits and green leafy vegetables, nuts like almonds and hazelnuts, walnuts, and also use olive oil. Avoid fast foods and fried foods as much as possible. Avoid sweets and sugar as sugar use has been linked to worsening of memory function.  There is always a concern of gradual progression of memory problems. If this is the case, then we may need to adjust level of care according to patient needs. Support, both to the patient and caregiver, should then be put into place.

## 2018-06-10 NOTE — Progress Notes (Signed)
NEUROLOGY FOLLOW UP OFFICE NOTE  Jaclyn SheerJulie S Robertson [id number] 25-Feb-1935  HISTORY OF PRESENT ILLNESS: I had the pleasure of seeing Jaclyn DissJulie Robertson in follow-up in the neurology clinic on 06/10/2018.  The patient was last seen 6 months ago for Alzheimer's dementia. She is again accompanied by her husband who helps supplement the history today.   Since her last visit, her husband reports progressive worsening of memory, asking repeated questions within a few minutes. He is also noting more anxiety, she wants to be with him all the time and gets anxious if he is not there. He denies any paranoia or hallucinations. She continues to drive short distances without getting lost. She does note at times she has to think which way she wanted to go. She continues to manage her medications, her husband checks behind her. He manages their finances. 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 She is independent with dressing and bathing. She is taking Donepezil 10mg  qhs and Memantine 10mg  BID. Her husband has noticed that she is more verbal in her sleep, a couple of times she has had larger movements. She denies any headaches, dizziness, diplopia, dysarthria, dysphagia, neck/back pain, focal numbness/tingling/weakness. No falls.   HPI 08/22/2016: This is an 83 yo LH woman with worsening memory. Her husband stated noticing changes a couple of years ago, she would sometimes ask the same questions repeatedly. She talked to her prior PCP about this last fall, she would mostly have difficulty remembering names, schedules. She would get lost driving, forgetting she was supposed to turn. This has happened more in unfamiliar roads, but sometimes she has missed a turn going home. She denies any missed bill payments, no missed medications. She continues to cook without leaving the stove on. She misplaces things frequently. She has been told she repeats herself, but reports that hearing is also a problem. She has noticed more word-finding  difficulties in the past few months. 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 She has been taking Aricept 10mg  daily and has had "wild crazy dreams" at night. She states Xanax was started for the dreams. She denies any anxiety but her husband states she gets upset at herself easily. No paranoia or hallucinations. She denies any significant head injuries. No family history of dementia. She drinks 1-2 glasses of wine every night.   Records and images were personally reviewed where available. She underwent Neuropsychological testing in January 2019 with results indicating mild dementia, most likely due to Alzheimer's disease. Her cognitive profile was very concerning for medial temporal lobe involvement. MRI brain without contrast done 09/25/16 did not show any acute changes, there was moderate diffuse atrophy, mild chronic microvascular disease.   PAST MEDICAL HISTORY: Past Medical History:  Diagnosis Date  . Dementia     MEDICATIONS:  Outpatient Encounter Medications as of 06/10/2018  Medication Sig  . Diphenhyd-Benzeth-Menth-Zn Ace (CALAGEL MAXIMUM STRENGTH) GEL Apply 1 application topically daily as needed (itching/pain).  Marland Kitchen. donepezil (ARICEPT) 10 MG tablet Take 10 mg by mouth at bedtime  . ibuprofen (ADVIL,MOTRIN) 200 MG tablet Take 200 mg by mouth daily as needed for headache or moderate pain.  . Melatonin 3 MG TABS Take 6 mg by mouth at bedtime.  . memantine (NAMENDA) 10 MG tablet Take 10 mg by mouth 2 (two)  times daily.  . Multiple Vitamins-Minerals (CENTRUM SILVER ULTRA WOMENS) TABS Take 1 tablet by mouth daily.   Bertram Gala Glycol-Propyl Glycol (SYSTANE ULTRA OP) Place 1 drop into both eyes daily as needed (dry eyes).  . raloxifene (EVISTA) 60 MG tablet Take 60 mg by mouth once daily    No facility-administered encounter medications on file as of 06/10/2018.     ALLERGIES: Allergies  Allergen Reactions  . Levaquin  [Levofloxacin In D5w] Rash    FAMILY HISTORY: Family History  Problem Relation Age of Onset  . Seizures Mother     SOCIAL HISTORY: Social History   Socioeconomic History  . Marital status: Married    Spouse name: Not on file  . Number of children: Not on file  . Years of education: Not on file  . Highest education level: Not on file  Occupational History  . Not on file  Social Needs  . Financial resource strain: Not on file  . Food insecurity:    Worry: Not on file    Inability: Not on file  . Transportation needs:    Medical: Not on file    Non-medical: Not on file  Tobacco Use  . Smoking status: Never Smoker  . Smokeless tobacco: Never Used  Substance and Sexual Activity  . Alcohol use: Yes    Alcohol/week: 2.0 standard drinks    Types: 2 Glasses of wine per week    Comment: Occ.  . Drug use: No  . Sexual activity: Not Currently    Birth control/protection: Post-menopausal  Lifestyle  . Physical activity:    Days per week: Not on file    Minutes per session: Not on file  . Stress: Not on file  Relationships  . Social connections:    Talks on phone: Not on file    Gets together: Not on file    Attends religious service: Not on file    Active member of club or organization: Not on file    Attends meetings of clubs or organizations: Not on file    Relationship status: Not on file  . Intimate partner violence:    Fear of current or ex partner: Not on file    Emotionally abused: Not on file    Physically abused: Not on file    Forced sexual activity: Not on file  Other Topics Concern  . Not on file  Social History Narrative  . Not on file    REVIEW OF SYSTEMS: Constitutional: No fevers, chills, or sweats, no generalized fatigue, change in appetite Eyes: No visual changes, double vision, eye pain Ear, nose and  throat: No hearing loss, ear pain, nasal congestion, sore throat Cardiovascular: No chest pain, palpitations Respiratory:  No shortness of breath at rest or with exertion, wheezes GastrointestinaI: No nausea, vomiting, diarrhea, abdominal pain, fecal incontinence Genitourinary:  No dysuria, urinary retention or frequency Musculoskeletal:  No neck pain, back pain Integumentary: No rash, pruritus, skin lesions Neurological: as above Psychiatric: No depression, insomnia,+ anxiety Endocrine: No palpitations, fatigue, diaphoresis, mood swings, change in appetite, change in weight, increased thirst Hematologic/Lymphatic:  No anemia, purpura, petechiae. Allergic/Immunologic: no itchy/runny eyes, nasal congestion, recent allergic reactions, rashes  PHYSICAL EXAM: Vitals:   06/10/18 1207  BP: 126/64  Pulse: (!) 57  SpO2: 99%   General: No acute distress Head:  Normocephalic/atraumatic Neck: supple, no paraspinal tenderness, full range of motion Heart:  Regular rate and rhythm Lungs:  Clear to auscultation bilaterally Back: No paraspinal tenderness Skin/Extremities: No rash,  no edema Neurological Exam: alert and oriented to person, place, and month/year/season. No aphasia or dysarthria. Fund of knowledge is appropriate.  Recent and remote memory are impaired.  Attention and concentration are normal.    Able to name objects and repeat phrases. CDT 2/5 (drew numbers counterclockwise) MMSE - Mini Mental State Exam 06/10/2018  Orientation to time 3  Orientation to Place 5  Registration 3  Attention/ Calculation 4  Recall 0  Language- name 2 objects 2  Language- repeat 1  Language- follow 3 step command 3  Language- read & follow direction 1  Write a sentence 1  Copy design 0  Total score 23   Montreal Cognitive Assessment  02/20/2017 08/22/2016  Visuospatial/ Executive (0/5) 4 5  Naming (0/3) 3 3  Attention: Read list of digits (0/2) 2 2  Attention: Read list of letters (0/1) 1 1   Attention: Serial 7 subtraction starting at 100 (0/3) 3 3  Language: Repeat phrase (0/2) 2 2  Language : Fluency (0/1) 0 1  Abstraction (0/2) 2 1  Delayed Recall (0/5) 1 0  Orientation (0/6) 5 5  Total 23 23   Cranial nerves: Pupils equal, round.  Extraocular movements intact with no nystagmus. No facial asymmetry. Motor: 5/5 throughout with no pronator drift. No incoordination on finger to nose testing. Gait narrow-based and steady. No ataxia.  IMPRESSION: This is an 83 yo LH woman with worsening memory, with mild dementia likely due to Alzheimer's disease. MMSE today 23/30. She is taking Donepezil 10mg  daily and Memantine 10mg  BID. Her husband reports vivid dreams, she will try taking the Donepezil in the morning. Her husband reports more anxiety, we agreed to start low dose Lexapro 10mg  daily, side effects discussed. He will update [place] in a month, we may uptitrate as tolerated. Continue to monitor driving. We again discussed the importance of control of vascular risk factors, physical exercise, and brain stimulation exercises for brain health. Caregiver support also provided today. She will follow-up in 6 months and knows to call for any changes.   Thank you for allowing me to participate in her care.  Please do not hesitate to call for any questions or concerns.  The duration of this appointment visit was 30 minutes of face-to-face time with the patient.  Greater than 50% of this time was spent in counseling, explanation of diagnosis, planning of further management, and coordination of care.   Patrcia Dolly, M.D.   CC: Dr. Lysbeth Galas

## 2018-06-19 DIAGNOSIS — H524 Presbyopia: Secondary | ICD-10-CM | POA: Diagnosis not present

## 2018-06-19 DIAGNOSIS — Z961 Presence of intraocular lens: Secondary | ICD-10-CM | POA: Diagnosis not present

## 2018-06-19 DIAGNOSIS — H26493 Other secondary cataract, bilateral: Secondary | ICD-10-CM | POA: Diagnosis not present

## 2018-08-28 DIAGNOSIS — Z961 Presence of intraocular lens: Secondary | ICD-10-CM | POA: Diagnosis not present

## 2018-08-28 DIAGNOSIS — H26493 Other secondary cataract, bilateral: Secondary | ICD-10-CM | POA: Diagnosis not present

## 2018-09-10 DIAGNOSIS — R159 Full incontinence of feces: Secondary | ICD-10-CM | POA: Diagnosis not present

## 2018-09-10 DIAGNOSIS — R4189 Other symptoms and signs involving cognitive functions and awareness: Secondary | ICD-10-CM | POA: Diagnosis not present

## 2018-09-10 DIAGNOSIS — R413 Other amnesia: Secondary | ICD-10-CM | POA: Diagnosis not present

## 2018-12-03 ENCOUNTER — Encounter: Payer: Self-pay | Admitting: Neurology

## 2018-12-09 ENCOUNTER — Ambulatory Visit: Payer: PPO | Admitting: Neurology

## 2019-02-02 ENCOUNTER — Telehealth: Payer: Self-pay | Admitting: Neurology

## 2019-02-02 NOTE — Telephone Encounter (Signed)
Called patient spouse no answer left message to call office back regarding patient. NO DPR in patient chart, not sure if we can speak with him.

## 2019-02-02 NOTE — Telephone Encounter (Signed)
New Message  Patients husband verbalized needing to speak to nurse in regards to his wife's dementia progression.

## 2019-02-03 NOTE — Telephone Encounter (Signed)
Spoke with pts husband. He states that the pt is hearing voices. She says that she keeps hearing a man singing. The husband says that she has been sleeping good. He admits that she talks in her sleep and has had a couple of nightmares. Husband states that there have been no medication changes and the pt has had no infections such as a UTI, cold, fevers.  If an appointment is needed he believes that it could be virtual.  Last office visit was 05/2018.

## 2019-02-03 NOTE — Telephone Encounter (Signed)
Husband informed ton increase Lexapro to 20mg  qd. Virtual visit scheduled for 03/03/19 at 3 due to pt requesting a afternoon appt. Husband stated they could not come to tomorrows opening due to his work schedule.

## 2019-02-03 NOTE — Telephone Encounter (Signed)
Pls have him increase the Lexapro to 20mg  daily and schedule a virtual visit. Thanks

## 2019-02-24 ENCOUNTER — Ambulatory Visit: Payer: Self-pay | Admitting: Physician Assistant

## 2019-03-03 ENCOUNTER — Other Ambulatory Visit: Payer: Self-pay

## 2019-03-03 ENCOUNTER — Encounter: Payer: Self-pay | Admitting: Neurology

## 2019-03-03 ENCOUNTER — Telehealth (INDEPENDENT_AMBULATORY_CARE_PROVIDER_SITE_OTHER): Payer: PPO | Admitting: Neurology

## 2019-03-03 VITALS — Ht 64.0 in | Wt 114.0 lb

## 2019-03-03 DIAGNOSIS — G301 Alzheimer's disease with late onset: Secondary | ICD-10-CM

## 2019-03-03 DIAGNOSIS — F0280 Dementia in other diseases classified elsewhere without behavioral disturbance: Secondary | ICD-10-CM

## 2019-03-03 MED ORDER — DONEPEZIL HCL 10 MG PO TABS: ORAL_TABLET | ORAL | 3 refills | Status: DC

## 2019-03-03 MED ORDER — ESCITALOPRAM OXALATE 20 MG PO TABS: 20.0000 mg | ORAL_TABLET | Freq: Every day | ORAL | 3 refills | Status: DC

## 2019-03-03 MED ORDER — MEMANTINE HCL 10 MG PO TABS: 10.0000 mg | ORAL_TABLET | Freq: Two times a day (BID) | ORAL | 3 refills | Status: DC

## 2019-03-03 NOTE — Progress Notes (Signed)
Virtual Visit via Video Note The purpose of this virtual visit is to provide medical care while limiting exposure to the novel coronavirus.    Consent was obtained for video visit:  Yes.   Answered questions that patient had about telehealth interaction:  Yes.   I discussed the limitations, risks, security and privacy concerns of performing an evaluation and management service by telemedicine. I also discussed with the patient that there may be a patient responsible charge related to this service. The patient expressed understanding and agreed to proceed.  Pt location: Home Physician Location: office Name of referring provider:  Joette CatchingNyland, Leonard, MD I connected with Jaclyn Robertson at patients initiation/request on 03/03/2019 at  3:00 PM EDT by video enabled telemedicine application and verified that I am speaking with the correct person using two identifiers. Pt MRN:  [id number] Pt DOB:  02-11-1935 Video Participants:  Jaclyn Robertson;  Jaclyn Robertson (spouse)   History of Present Illness:  The patient was seen as a virtual video visit on 03/03/2019. Her husband is present to provide additional information. She was last seen 9 months ago for Alzheimer's dementia.  MMSE 23/30 in November 2019.  She is on Donepezil 10mg  daily and Memantine 10mg  BID without side effects.Since her last visit, she reports her memory is a little iffy. Her husband reports that she repeats herself. She was started on Lexapro 10mg  daily on last visit, her husband denies any anxiety and feels it helps. She was previously managing her own medications but got confused, he started managing them this week. Her husband manages finances. She does not drive. She is independent with dressing and bathing. Her husband reports more talking in her sleep, she gets out once in a while and flails around a little. No paranoia. She may be having hallucinations, she has heard a man singing, she can hear some of the words. No visual hallucinations.  No falls. She mostly walks the dog and does housework without difficulties. She denies any headaches, dizziness, vision changes, focal numbness/tingling/weakness, no falls. They have an aide coming three times a week but the aide had been sick the past month.   History on Initial Assessment 08/22/2016: This is a 84 yo LH woman with worsening memory. Her husband stated noticing changes a couple of years ago, she would sometimes ask the same questions repeatedly. She talked to her prior PCP about this last fall, she would mostly have difficulty remembering names, schedules. She would get lost driving, forgetting she was supposed to turn. This has happened more in unfamiliar roads, but sometimes she has missed a turn going home. She denies any missed bill payments, no missed medications. She continues to cook without leaving the stove on. She misplaces things frequently. She has been told she repeats herself, but reports that hearing is also a problem. She has noticed more word-finding difficulties in the past few months. 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 She has been taking Aricept 10mg  daily and has had "wild crazy dreams" at night. She states Xanax was started for the dreams. She denies any anxiety but her husband states she gets upset at herself easily. No paranoia or hallucinations. She denies any significant head injuries. No family history of dementia. She drinks 1-2 glasses of wine every night.  Records and images were personally reviewed where available. She underwent Neuropsychological testing in January 2019 with results indicating mild dementia, most likely due to Alzheimer's disease. Her cognitive profile was very concerning for medial temporal lobe involvement. MRI brain without contrast done  09/25/16 did not show any acute changes, there was moderate diffuse atrophy, mild chronic microvascular disease.   Outpatient Encounter Medications as of 03/03/2019  Medication Sig   donepezil (ARICEPT) 10 MG tablet Take 1 tablet daily   memantine (NAMENDA) 10 MG tablet Take 1 tablet (10 mg total) by mouth 2 (two) times daily.           escitalopram (LEXAPRO) 10 MG tablet Take 1 tablet (10 mg total) by mouth daily.                               No facility-administered encounter medications on file as of 03/03/2019.     Observations/Objective:   Vitals:   03/03/19 1442  Weight: 114 lb (51.7 kg)  Height: 5\' 4"  (1.626 m)   GEN:  The patient appears stated age and is in NAD.  Neurological examination: Patient is awake, alert, oriented to person, place, month/season. Year is 2015. No aphasia or dysarthria. Intact fluency and comprehension. Remote and recent memory impaired. Able to name and repeat. Cranial nerves: Extraocular movements intact with no nystagmus. No facial asymmetry. Motor: moves all extremities symmetrically, at least anti-gravity x 4. No incoordination on finger to nose testing. Gait: narrow-based and steady, able to tandem walk adequately. Negative Romberg test.  MMSE - Mini Mental State Exam 03/03/2019 06/10/2018  Orientation to time 2 3  Orientation to Place 5 5  Registration 3 3  Attention/ Calculation 1 4  Recall 0 0  Language- name 2 objects 2 2  Language- repeat 1 1  Language- follow 3 step command 3 3  Language- read & follow direction 0 1  Write a sentence 1 1  Copy design 0 0  Total score 18 23    Assessment and Plan:   This is an 84 yo LH woman with worsening memory, with mild dementia likely due to Alzheimer's disease. MMSE today 18/30 (23/30 in Nov 2019). Continue Donepezil 10mg  daily and Memantine 10mg  BID. Anxiety better on Lexapro 10mg  daily. She does not drive. Continue close supervision. She will follow-up in 6 months and knows to  call for any changes.    Follow Up Instructions:   -I discussed the assessment and treatment plan with the patient. The patient was provided an opportunity to ask questions and all were answered. The patient agreed with the plan and demonstrated an understanding of the instructions.   The patient was advised to call back or seek an in-person evaluation if the symptoms worsen or if the condition fails to improve as anticipated.    Cameron Sprang, MD

## 2019-03-06 ENCOUNTER — Other Ambulatory Visit: Payer: Self-pay

## 2019-03-09 ENCOUNTER — Other Ambulatory Visit: Payer: Self-pay

## 2019-03-09 ENCOUNTER — Encounter: Payer: Self-pay | Admitting: Physician Assistant

## 2019-03-09 ENCOUNTER — Ambulatory Visit (INDEPENDENT_AMBULATORY_CARE_PROVIDER_SITE_OTHER): Payer: PPO | Admitting: Physician Assistant

## 2019-03-09 VITALS — BP 122/70 | HR 55 | Temp 97.8°F | Ht 64.0 in | Wt 120.4 lb

## 2019-03-09 DIAGNOSIS — G479 Sleep disorder, unspecified: Secondary | ICD-10-CM

## 2019-03-09 DIAGNOSIS — G301 Alzheimer's disease with late onset: Secondary | ICD-10-CM

## 2019-03-09 DIAGNOSIS — M810 Age-related osteoporosis without current pathological fracture: Secondary | ICD-10-CM | POA: Diagnosis not present

## 2019-03-09 DIAGNOSIS — F0280 Dementia in other diseases classified elsewhere without behavioral disturbance: Secondary | ICD-10-CM

## 2019-03-09 MED ORDER — QUETIAPINE FUMARATE 25 MG PO TABS: 25.0000 mg | ORAL_TABLET | Freq: Every day | ORAL | 0 refills | Status: DC

## 2019-03-09 MED ORDER — ESCITALOPRAM OXALATE 10 MG PO TABS: 10.0000 mg | ORAL_TABLET | Freq: Every day | ORAL | 3 refills | Status: DC

## 2019-03-09 MED ORDER — RALOXIFENE HCL 60 MG PO TABS: ORAL_TABLET | ORAL | 3 refills | Status: DC

## 2019-03-10 NOTE — Progress Notes (Signed)
BP 122/70   Pulse (!) 55   Temp 97.8 F (36.6 C) (Temporal)   Ht 5\' 4"  (1.626 m)   Wt 120 lb 6.4 oz (54.6 kg)   SpO2 100%   BMI 20.67 kg/m    Subjective:    Patient ID: Jaclyn Robertson, female    DOB: 12-Oct-1934, 84 y.o.   MRN: [id number]  HPI: Jaclyn Robertson is a 84 y.o. female presenting on 03/09/2019 for New Patient (Initial Visit)  This patient comes in as a new patient to be established. She is a longstanding patient of Dr. Edrick Oh.  In reviewing her chart the patient is extremely healthy.  She does have some osteoporosis and takes Evista for that.  Otherwise her dementia is the most problematic thing.  However she is not combative, difficult behavior.  She does sleep at night.  The most problematic thing is that she will have very vivid bad dreams and she does seem to wake up from that.  And it disturbs her sleep.  They would like to try something to see if this can be calm down.  She is continued on her regular medication of Aricept, Namenda.  She is seen by Dr. Delice Lesch, neurology in [place].  Her husband controls the medication.  They will try this for the next few weeks and see if it helps calm down her sleep if not they can give [place] a call back.  If there are any difficulties with the medication please stop it and let us know.  Have reviewed all of her chart through the care everywhere program on epic.  Her labs are also exceptionally good.  Her last ones were performed in February 2020.  We can wait until February 2021. Past Medical History:  Diagnosis Date  . Dementia ([place])    Relevant past medical, surgical, family and social history reviewed and updated as indicated. Interim medical history since our last visit reviewed. Allergies and medications reviewed and updated. DATA REVIEWED: CHART IN EPIC  Family History reviewed for pertinent findings.  Review of Systems  Constitutional: Negative.  Negative for activity change, fatigue and fever.  HENT: Negative.   Eyes:  Negative.   Respiratory: Negative.  Negative for cough.   Cardiovascular: Negative.  Negative for chest pain.  Gastrointestinal: Negative.  Negative for abdominal pain.  Endocrine: Negative.   Genitourinary: Negative.  Negative for dysuria.  Musculoskeletal: Negative.   Skin: Negative.   Neurological: Negative.  Negative for tremors and weakness.  Psychiatric/Behavioral: Positive for confusion, decreased concentration and sleep disturbance. Negative for behavioral problems, dysphoric mood, hallucinations, self-injury and suicidal ideas. The patient is not nervous/anxious and is not hyperactive.     Allergies as of 03/09/2019      Reactions   Levaquin  [levofloxacin In D5w] Rash      Medication List       Accurate as of March 09, 2019 11:59 PM. If you have any questions, ask your nurse or doctor.        STOP taking these medications   Calagel Maximum Strength Gel Stopped by: Terald Sleeper, PA-C   Centrum Silver Ultra Womens Tabs Stopped by: Terald Sleeper, PA-C   ibuprofen 200 MG tablet Commonly known as: ADVIL Stopped by: Terald Sleeper, PA-C   SYSTANE ULTRA OP Stopped by: Terald Sleeper, PA-C     TAKE these medications   donepezil 10 MG tablet Commonly known as: ARICEPT Take 1 tablet daily   escitalopram 10 MG tablet  Commonly known as: LEXAPRO Take 1 tablet (10 mg total) by mouth daily. What changed:   how much to take  when to take this  Another medication with the same name was removed. Continue taking this medication, and follow the directions you see here. Changed by: Remus LofflerAngel S Hoorain Kozakiewicz, PA-C   memantine 10 MG tablet Commonly known as: NAMENDA Take 1 tablet (10 mg total) by mouth 2 (two) times daily.   QUEtiapine 25 MG tablet Commonly known as: SEROquel Take 1 tablet (25 mg total) by mouth at bedtime. Started by: Remus LofflerAngel S Narayan Scull, PA-C   raloxifene 60 MG tablet Commonly known as: EVISTA Take 60 mg by mouth once daily          Objective:    BP  122/70   Pulse (!) 55   Temp 97.8 F (36.6 C) (Temporal)   Ht 5\' 4"  (1.626 m)   Wt 120 lb 6.4 oz (54.6 kg)   SpO2 100%   BMI 20.67 kg/m   Allergies  Allergen Reactions  . Levaquin  [Levofloxacin In D5w] Rash    Wt Readings from Last 3 Encounters:  03/09/19 120 lb 6.4 oz (54.6 kg)  03/03/19 114 lb (51.7 kg)  06/10/18 115 lb (52.2 kg)    Physical Exam Constitutional:      General: She is not in acute distress.    Appearance: Normal appearance. She is well-developed.  HENT:     Head: Normocephalic and atraumatic.  Cardiovascular:     Rate and Rhythm: Normal rate.  Pulmonary:     Effort: Pulmonary effort is normal.  Skin:    General: Skin is warm and dry.     Findings: No rash.  Neurological:     Mental Status: She is alert and oriented to person, place, and time.     Deep Tendon Reflexes: Reflexes are normal and symmetric.         Assessment & Plan:   1. Age-related osteoporosis without current pathological fracture - raloxifene (EVISTA) 60 MG tablet; Take 60 mg by mouth once daily  Dispense: 90 tablet; Refill: 3  2. Late onset Alzheimer's disease without behavioral disturbance (HCC) - escitalopram (LEXAPRO) 10 MG tablet; Take 1 tablet (10 mg total) by mouth daily.  Dispense: 90 tablet; Refill: 3  3. Sleep disorder Try 1 tablet at bedtime to see if this can help her not have very bad vivid dreams.  Husband will monitor medication. - QUEtiapine (SEROQUEL) 25 MG tablet; Take 1 tablet (25 mg total) by mouth at bedtime.  Dispense: 90 tablet; Refill: 0   Continue all other maintenance medications as listed above.  Follow up plan: Return in about 6 months (around 09/09/2019) for reheck and labs.  Educational handout given for survey  Remus LofflerAngel S. Lakara Weiland PA-C [facility] [address]  [place], [place] [address] [phone]   03/10/2019, 2:03 PM

## 2019-06-26 ENCOUNTER — Other Ambulatory Visit: Payer: Self-pay | Admitting: Physician Assistant

## 2019-06-26 DIAGNOSIS — G479 Sleep disorder, unspecified: Secondary | ICD-10-CM

## 2019-07-11 DIAGNOSIS — S0003XA Contusion of scalp, initial encounter: Secondary | ICD-10-CM | POA: Diagnosis not present

## 2019-07-22 ENCOUNTER — Other Ambulatory Visit: Payer: Self-pay

## 2019-07-22 ENCOUNTER — Telehealth: Payer: Self-pay | Admitting: Physician Assistant

## 2019-07-22 ENCOUNTER — Other Ambulatory Visit: Payer: Self-pay | Admitting: Physician Assistant

## 2019-07-22 MED ORDER — HYDROCODONE-ACETAMINOPHEN 5-325 MG PO TABS: 1.0000 | ORAL_TABLET | Freq: Three times a day (TID) | ORAL | 0 refills | Status: DC | PRN

## 2019-07-22 NOTE — Telephone Encounter (Signed)
What symptoms do you have? Patient fell and hurt her back and she has appt with Britney tomorrow and husband wanted to know if Lawanna Kobus could call her in something for pain until she was seen tomorrow. She has alzheimer's and spouse asked me to see if she could get something for pain. I told him she has to be seen and he couldn't come with her til tomorrow  How long have you been sick? Four days  Have you been seen for this problem? No  If your provider decides to give you a prescription, which pharmacy would you like for it to be sent to? [facility]   Patient informed that this information will be sent to the clinical staff for review and that they should receive a follow up call.

## 2019-07-22 NOTE — Telephone Encounter (Signed)
Lmtcb.

## 2019-07-22 NOTE — Telephone Encounter (Signed)
Sent a small number of medication to the pharmacy.  Please keep appointment.

## 2019-07-23 ENCOUNTER — Encounter: Payer: Self-pay | Admitting: Family Medicine

## 2019-07-23 ENCOUNTER — Ambulatory Visit (INDEPENDENT_AMBULATORY_CARE_PROVIDER_SITE_OTHER): Payer: PPO | Admitting: Family Medicine

## 2019-07-23 VITALS — BP 110/65 | HR 58 | Temp 96.2°F | Ht 64.0 in | Wt 127.2 lb

## 2019-07-23 DIAGNOSIS — Z23 Encounter for immunization: Secondary | ICD-10-CM | POA: Diagnosis not present

## 2019-07-23 DIAGNOSIS — M545 Low back pain, unspecified: Secondary | ICD-10-CM

## 2019-07-23 MED ORDER — METHOCARBAMOL 500 MG PO TABS: 500.0000 mg | ORAL_TABLET | Freq: Three times a day (TID) | ORAL | 0 refills | Status: DC | PRN

## 2019-07-23 NOTE — Patient Instructions (Signed)
Acute Back Pain, Adult Acute back pain is sudden and usually short-lived. It is often caused by an injury to the muscles and tissues in the back. The injury may result from:  A muscle or ligament getting overstretched or torn (strained). Ligaments are tissues that connect bones to each other. Lifting something improperly can cause a back strain.  Wear and tear (degeneration) of the spinal disks. Spinal disks are circular tissue that provides cushioning between the bones of the spine (vertebrae).  Twisting motions, such as while playing sports or doing yard work.  A hit to the back.  Arthritis. You may have a physical exam, lab tests, and imaging tests to find the cause of your pain. Acute back pain usually goes away with rest and home care. Follow these instructions at home: Managing pain, stiffness, and swelling  Take over-the-counter and prescription medicines only as told by your health care provider.  Your health care provider may recommend applying ice during the first 24-48 hours after your pain starts. To do this: ? Put ice in a plastic bag. ? Place a towel between your skin and the bag. ? Leave the ice on for 20 minutes, 2-3 times a day.  If directed, apply heat to the affected area as often as told by your health care provider. Use the heat source that your health care provider recommends, such as a moist heat pack or a heating pad. ? Place a towel between your skin and the heat source. ? Leave the heat on for 20-30 minutes. ? Remove the heat if your skin turns bright red. This is especially important if you are unable to feel pain, heat, or cold. You have a greater risk of getting burned. Activity   Do not stay in bed. Staying in bed for more than 1-2 days can delay your recovery.  Sit up and stand up straight. Avoid leaning forward when you sit, or hunching over when you stand. ? If you work at a desk, sit close to it so you do not need to lean over. Keep your chin tucked  in. Keep your neck drawn back, and keep your elbows bent at a right angle. Your arms should look like the letter "L." ? Sit high and close to the steering wheel when you drive. Add lower back (lumbar) support to your car seat, if needed.  Take short walks on even surfaces as soon as you are able. Try to increase the length of time you walk each day.  Do not sit, drive, or stand in one place for more than 30 minutes at a time. Sitting or standing for long periods of time can put stress on your back.  Do not drive or use heavy machinery while taking prescription pain medicine.  Use proper lifting techniques. When you bend and lift, use positions that put less stress on your back: ? Bend your knees. ? Keep the load close to your body. ? Avoid twisting.  Exercise regularly as told by your health care provider. Exercising helps your back heal faster and helps prevent back injuries by keeping muscles strong and flexible.  Work with a physical therapist to make a safe exercise program, as recommended by your health care provider. Do any exercises as told by your physical therapist. Lifestyle  Maintain a healthy weight. Extra weight puts stress on your back and makes it difficult to have good posture.  Avoid activities or situations that make you feel anxious or stressed. Stress and anxiety increase muscle   tension and can make back pain worse. Learn ways to manage anxiety and stress, such as through exercise. General instructions  Sleep on a firm mattress in a comfortable position. Try lying on your side with your knees slightly bent. If you lie on your back, put a pillow under your knees.  Follow your treatment plan as told by your health care provider. This may include: ? Cognitive or behavioral therapy. ? Acupuncture or massage therapy. ? Meditation or yoga. Contact a health care provider if:  You have pain that is not relieved with rest or medicine.  You have increasing pain going down  into your legs or buttocks.  Your pain does not improve after 2 weeks.  You have pain at night.  You lose weight without trying.  You have a fever or chills. Get help right away if:  You develop new bowel or bladder control problems.  You have unusual weakness or numbness in your arms or legs.  You develop nausea or vomiting.  You develop abdominal pain.  You feel faint. Summary  Acute back pain is sudden and usually short-lived.  Use proper lifting techniques. When you bend and lift, use positions that put less stress on your back.  Take over-the-counter and prescription medicines and apply heat or ice as directed by your health care provider. This information is not intended to replace advice given to you by your health care provider. Make sure you discuss any questions you have with your health care provider. Document Revised: 10/21/2018 Document Reviewed: 02/13/2017 Elsevier Patient Education  2020 Elsevier Inc.  

## 2019-07-23 NOTE — Progress Notes (Signed)
Assessment & Plan:  1. Acute right-sided low back pain without sciatica - Education provided on acute back pain.  I did prescribe a muscle relaxer as this may be effective for her.  Discussed that she should try to limit the amount of hydrocodone she is taking as it is a very strong pain medication, addictive, and a controlled substance.  Encouraged use of heat and muscle rubs.  Reassured that she is likely sore from her fall. - methocarbamol (ROBAXIN) 500 MG tablet; Take 1 tablet (500 mg total) by mouth every 8 (eight) hours as needed for muscle spasms.  Dispense: 30 tablet; Refill: 0  2. Need for immunization against influenza - Flu Vaccine QUAD High Dose(Fluad)   Follow up plan: Return if symptoms worsen or fail to improve.  Deliah Boston, MSN, APRN, FNP-C [facility]  Subjective:   Patient ID: Jaclyn Robertson, female    DOB: [birth year], 85 y.o.   MRN: [id number]  HPI: Jaclyn Robertson is a 85 y.o. female presenting on 07/23/2019 for Fall (lower back pain, tripped on dog bed )  Patient is accompanied today by her husband due to dementia.  She is happy with him remaining present.  Back Pain: Patient presents for presents evaluation of low back problems.  Symptoms have been present for 5 days and include pain in lumbar spine all the way across (aching in character; 7/10 in severity). Initial inciting event: fall after tripping over the dog bed. Symptoms are worst: morning. Alleviating factors identifiable by patient are medication Norco 5/325. Exacerbating factors identifiable by patient are standing. Treatments so far initiated by patient: pain medication and rest Previous lower back problems: none. Previous workup: none. Previous treatments: none.   ROS: Negative unless specifically indicated above in HPI.   Relevant past medical history reviewed and updated as indicated.   Allergies and medications reviewed and updated.   Current Outpatient Medications:  .   donepezil (ARICEPT) 10 MG tablet, Take 1 tablet daily, Disp: 90 tablet, Rfl: 3 .  escitalopram (LEXAPRO) 10 MG tablet, Take 1 tablet (10 mg total) by mouth daily., Disp: 90 tablet, Rfl: 3 .  HYDROcodone-acetaminophen (NORCO) 5-325 MG tablet, Take 1 tablet by mouth every 8 (eight) hours as needed for severe pain., Disp: 15 tablet, Rfl: 0 .  memantine (NAMENDA) 10 MG tablet, Take 1 tablet (10 mg total) by mouth 2 (two) times daily., Disp: 180 tablet, Rfl: 3 .  QUEtiapine (SEROQUEL) 25 MG tablet, Take 1 tablet (25 mg total) by mouth at bedtime., Disp: 90 tablet, Rfl: 0 .  raloxifene (EVISTA) 60 MG tablet, Take 60 mg by mouth once daily, Disp: 90 tablet, Rfl: 3 .  methocarbamol (ROBAXIN) 500 MG tablet, Take 1 tablet (500 mg total) by mouth every 8 (eight) hours as needed for muscle spasms., Disp: 30 tablet, Rfl: 0  Allergies  Allergen Reactions  . Levaquin  [Levofloxacin In D5w] Rash    Objective:   BP 110/65   Pulse (!) 58   Temp (!) 96.2 F (35.7 C) (Temporal)   Ht 5\' 4"  (1.626 m)   Wt 127 lb 3.2 oz (57.7 kg)   SpO2 97%   BMI 21.83 kg/m    Physical Exam Vitals reviewed.  Constitutional:      General: She is not in acute distress.    Appearance: Normal appearance. She is normal weight. She is not ill-appearing, toxic-appearing or diaphoretic.  HENT:     Head: Normocephalic and atraumatic.  Eyes:  General: No scleral icterus.       Right eye: No discharge.        Left eye: No discharge.     Conjunctiva/sclera: Conjunctivae normal.  Cardiovascular:     Rate and Rhythm: Normal rate.  Pulmonary:     Effort: Pulmonary effort is normal. No respiratory distress.  Musculoskeletal:        General: Normal range of motion.     Cervical back: Normal range of motion.     Lumbar back: Tenderness (on the left side of her back) present. No swelling, edema, deformity, signs of trauma, lacerations, spasms or bony tenderness. Normal range of motion. Negative right straight leg raise test and  negative left straight leg raise test.     Right hip: Normal.     Left hip: Normal.  Skin:    General: Skin is warm and dry.     Capillary Refill: Capillary refill takes less than 2 seconds.  Neurological:     General: No focal deficit present.     Mental Status: She is alert and oriented to person, place, and time. Mental status is at baseline.  Psychiatric:        Mood and Affect: Mood normal.        Behavior: Behavior normal.        Thought Content: Thought content normal.        Judgment: Judgment normal.

## 2019-07-31 ENCOUNTER — Other Ambulatory Visit: Payer: Self-pay | Admitting: Physician Assistant

## 2019-07-31 MED ORDER — PREDNISONE 10 MG (21) PO TBPK: ORAL_TABLET | ORAL | 0 refills | Status: DC

## 2019-07-31 NOTE — Progress Notes (Unsigned)
ster

## 2019-08-13 ENCOUNTER — Ambulatory Visit: Payer: Self-pay

## 2019-08-20 ENCOUNTER — Ambulatory Visit: Payer: PPO | Attending: Internal Medicine

## 2019-08-20 DIAGNOSIS — Z23 Encounter for immunization: Secondary | ICD-10-CM | POA: Insufficient documentation

## 2019-08-20 NOTE — Progress Notes (Signed)
   Covid-19 Vaccination Clinic  Name:  GERALINE HALBERSTADT    MRN: [id number] DOB: 1934-08-04  08/20/2019  Ms. Mangal was observed post Covid-19 immunization for 15 minutes without incidence. She was provided with Vaccine Information Sheet and instruction to access the V-Safe system.   Ms. Frank was instructed to call 911 with any severe reactions post vaccine: Marland Kitchen Difficulty breathing  . Swelling of your face and throat  . A fast heartbeat  . A bad rash all over your body  . Dizziness and weakness    Immunizations Administered    Name Date Dose VIS Date Route   Moderna COVID-19 Vaccine 08/20/2019  1:31 PM 0.5 mL 06/16/2019 Intramuscular   Manufacturer: Moderna   Lot: [id number]   NDC: 62194-712-52

## 2019-08-22 ENCOUNTER — Ambulatory Visit: Payer: Self-pay

## 2019-08-27 ENCOUNTER — Telehealth: Payer: Self-pay | Admitting: Neurology

## 2019-08-27 NOTE — Telephone Encounter (Signed)
Patient's spouse called requesting some advice re: the patient is reluctant to start wearing Depends undergarments and she needs them.

## 2019-08-27 NOTE — Telephone Encounter (Signed)
Spoke with pt husband he asked about advise to get his wife to wear depends, advised that if the regular depends are to bulky to maybe try the new ones seen on tv that are more like underwear to see if she likes them better,

## 2019-08-30 ENCOUNTER — Ambulatory Visit: Payer: Self-pay

## 2019-09-21 ENCOUNTER — Ambulatory Visit: Payer: PPO | Attending: Internal Medicine

## 2019-09-21 DIAGNOSIS — Z23 Encounter for immunization: Secondary | ICD-10-CM | POA: Insufficient documentation

## 2019-09-21 NOTE — Progress Notes (Signed)
   Covid-19 Vaccination Clinic  Name:  Jaclyn Robertson    MRN: [id number] DOB: 1935/03/28  09/21/2019  [name] was observed post Covid-19 immunization for 15 minutes without incident. She was provided with Vaccine Information Sheet and instruction to access the V-Safe system.   [name] was instructed to call 911 with any severe reactions post vaccine: Marland Kitchen Difficulty breathing  . Swelling of face and throat  . A fast heartbeat  . A bad rash all over body  . Dizziness and weakness   Immunizations Administered    Name Date Dose VIS Date Route   Moderna COVID-19 Vaccine 09/21/2019  1:23 PM 0.5 mL 06/16/2019 Intramuscular   Manufacturer: Moderna   Lot: [id number]   NDC: 01749-449-67

## 2019-09-22 DIAGNOSIS — Z8659 Personal history of other mental and behavioral disorders: Secondary | ICD-10-CM | POA: Diagnosis not present

## 2019-09-22 DIAGNOSIS — R410 Disorientation, unspecified: Secondary | ICD-10-CM | POA: Diagnosis not present

## 2019-09-22 DIAGNOSIS — R509 Fever, unspecified: Secondary | ICD-10-CM | POA: Diagnosis not present

## 2019-09-28 ENCOUNTER — Other Ambulatory Visit: Payer: Self-pay

## 2019-09-29 ENCOUNTER — Ambulatory Visit (INDEPENDENT_AMBULATORY_CARE_PROVIDER_SITE_OTHER): Payer: PPO | Admitting: Family Medicine

## 2019-09-29 ENCOUNTER — Encounter: Payer: Self-pay | Admitting: Family Medicine

## 2019-09-29 ENCOUNTER — Ambulatory Visit (INDEPENDENT_AMBULATORY_CARE_PROVIDER_SITE_OTHER): Payer: PPO

## 2019-09-29 ENCOUNTER — Other Ambulatory Visit: Payer: Self-pay

## 2019-09-29 ENCOUNTER — Telehealth: Payer: Self-pay

## 2019-09-29 VITALS — BP 100/56 | HR 68 | Temp 99.1°F | Resp 20 | Ht 64.0 in | Wt 127.0 lb

## 2019-09-29 DIAGNOSIS — M549 Dorsalgia, unspecified: Secondary | ICD-10-CM

## 2019-09-29 DIAGNOSIS — M439 Deforming dorsopathy, unspecified: Secondary | ICD-10-CM | POA: Insufficient documentation

## 2019-09-29 DIAGNOSIS — M546 Pain in thoracic spine: Secondary | ICD-10-CM | POA: Diagnosis not present

## 2019-09-29 DIAGNOSIS — M62838 Other muscle spasm: Secondary | ICD-10-CM

## 2019-09-29 MED ORDER — DICLOFENAC SODIUM 1 % EX GEL: 4.0000 g | Freq: Four times a day (QID) | CUTANEOUS | 1 refills | Status: DC

## 2019-09-29 NOTE — Telephone Encounter (Signed)
Sent referral to ortho and message to pools, thanks

## 2019-09-29 NOTE — Progress Notes (Signed)
Acute Office Visit  Subjective:    Patient ID: Jaclyn Robertson, female    DOB: [birth year], 85 y.o.   MRN: [id number]  Chief Complaint  Patient presents with  . Back Pain    mid - back pain - left side ( fall 2 mos ago - 2nd OV)     Back Pain This is a recurrent problem. The current episode started more than 1 month ago. The problem occurs intermittently. The problem has been waxing and waning since onset. The pain is present in the thoracic spine. The quality of the pain is described as aching and burning. The pain does not radiate. The pain is at a severity of 3/10. The pain is mild. The pain is worse during the day. The symptoms are aggravated by bending, twisting and position. Pertinent negatives include no abdominal pain, bladder incontinence, bowel incontinence, chest pain, dysuria, fever, headaches, leg pain, numbness, paresis, paresthesias, pelvic pain, perianal numbness, tingling, weakness or weight loss. She has tried analgesics for the symptoms. The treatment provided no relief.     Past Medical History:  Diagnosis Date  . Dementia [facility])     Past Surgical History:  Procedure Laterality Date  . CATARACT EXTRACTION W/PHACO Left 09/13/2017   Procedure: CATARACT EXTRACTION PHACO AND INTRAOCULAR LENS PLACEMENT (IOC);  Surgeon: Baruch Goldmann, MD;  Location: [facility];  Service: Ophthalmology;  Laterality: Left;  CDE: 4.81  . CATARACT EXTRACTION W/PHACO Right 11/15/2017   Procedure: CATARACT EXTRACTION PHACO AND INTRAOCULAR LENS PLACEMENT (IOC);  Surgeon: Baruch Goldmann, MD;  Location: [facility];  Service: Ophthalmology;  Laterality: Right;  CDE: 5.96    Family History  Problem Relation Age of Onset  . Seizures Mother     Social History   Socioeconomic History  . Marital status: Married    Spouse name: Not on file  . Number of children: Not on file  . Years of education: Not on file  . Highest education level: Not on file  Occupational History  . Not on file  Tobacco Use  .  Smoking status: Never Smoker  . Smokeless tobacco: Never Used  Substance and Sexual Activity  . Alcohol use: Yes    Alcohol/week: 2.0 standard drinks    Types: 2 Glasses of wine per week    Comment: Occ.  . Drug use: No  . Sexual activity: Not Currently    Birth control/protection: Post-menopausal  Other Topics Concern  . Not on file  Social History Narrative   Lives with husband in a two story home      Left handed      Highest level of edu- bachelor's   Social Determinants of Health   Financial Resource Strain:   . Difficulty of Paying Living Expenses:   Food Insecurity:   . Worried About Charity fundraiser in the Last Year:   . Arboriculturist in the Last Year:   Transportation Needs:   . Film/video editor (Medical):   Marland Kitchen Lack of Transportation (Non-Medical):   Physical Activity:   . Days of Exercise per Week:   . Minutes of Exercise per Session:   Stress:   . Feeling of Stress :   Social Connections:   . Frequency of Communication with Friends and Family:   . Frequency of Social Gatherings with Friends and Family:   . Attends Religious Services:   . Active Member of Clubs or Organizations:   . Attends Archivist Meetings:   Marland Kitchen Marital  Status:   Intimate Partner Violence:   . Fear of Current or Ex-Partner:   . Emotionally Abused:   Marland Kitchen Physically Abused:   . Sexually Abused:     Outpatient Medications Prior to Visit  Medication Sig Dispense Refill  . donepezil (ARICEPT) 10 MG tablet Take 1 tablet daily 90 tablet 3  . escitalopram (LEXAPRO) 10 MG tablet Take 1 tablet (10 mg total) by mouth daily. 90 tablet 3  . memantine (NAMENDA) 10 MG tablet Take 1 tablet (10 mg total) by mouth 2 (two) times daily. 180 tablet 3  . methocarbamol (ROBAXIN) 500 MG tablet Take 1 tablet (500 mg total) by mouth every 8 (eight) hours as needed for muscle spasms. 30 tablet 0  . QUEtiapine (SEROQUEL) 25 MG tablet Take 1 tablet (25 mg total) by mouth at bedtime. 90 tablet 0   . raloxifene (EVISTA) 60 MG tablet Take 60 mg by mouth once daily 90 tablet 3  . HYDROcodone-acetaminophen (NORCO) 5-325 MG tablet Take 1 tablet by mouth every 8 (eight) hours as needed for severe pain. 15 tablet 0  . predniSONE (STERAPRED UNI-PAK 21 TAB) 10 MG (21) TBPK tablet As directed x 6 days 21 tablet 0   No facility-administered medications prior to visit.    Allergies  Allergen Reactions  . Levaquin  [Levofloxacin In D5w] Rash    Review of Systems  Constitutional: Negative for activity change, appetite change, chills, diaphoresis, fatigue, fever, unexpected weight change and weight loss.  HENT: Negative.   Eyes: Negative.  Negative for photophobia and visual disturbance.  Respiratory: Negative for cough, chest tightness and shortness of breath.   Cardiovascular: Negative for chest pain, palpitations and leg swelling.  Gastrointestinal: Negative for abdominal pain, blood in stool, bowel incontinence, constipation, diarrhea, nausea and vomiting.  Endocrine: Negative.   Genitourinary: Negative for bladder incontinence, decreased urine volume, difficulty urinating, dysuria, frequency, pelvic pain and urgency.  Musculoskeletal: Positive for back pain and myalgias. Negative for arthralgias, gait problem, joint swelling, neck pain and neck stiffness.  Skin: Negative.   Allergic/Immunologic: Negative.   Neurological: Negative for dizziness, tingling, tremors, seizures, syncope, facial asymmetry, speech difficulty, weakness, light-headedness, numbness, headaches and paresthesias.  Hematological: Negative.   Psychiatric/Behavioral: Positive for confusion (baseline). Negative for hallucinations, sleep disturbance and suicidal ideas.  All other systems reviewed and are negative.      Objective:    Physical Exam Vitals and nursing note reviewed.  Constitutional:      General: She is not in acute distress.    Appearance: Normal appearance. She is well-developed, well-groomed and  normal weight. She is not ill-appearing, toxic-appearing or diaphoretic.  HENT:     Head: Normocephalic and atraumatic.     Jaw: There is normal jaw occlusion.     Right Ear: Hearing normal.     Left Ear: Hearing normal.     Nose: Nose normal.     Mouth/Throat:     Lips: Pink.     Mouth: Mucous membranes are moist.     Pharynx: Oropharynx is clear. Uvula midline.  Eyes:     General: Lids are normal.     Extraocular Movements: Extraocular movements intact.     Conjunctiva/sclera: Conjunctivae normal.     Pupils: Pupils are equal, round, and reactive to light.  Neck:     Thyroid: No thyroid mass, thyromegaly or thyroid tenderness.     Vascular: No carotid bruit or JVD.     Trachea: Trachea and phonation normal.  Cardiovascular:  Rate and Rhythm: Normal rate and regular rhythm.     Chest Wall: PMI is not displaced.     Pulses: Normal pulses.     Heart sounds: Normal heart sounds. No murmur. No friction rub. No gallop.   Pulmonary:     Effort: Pulmonary effort is normal. No respiratory distress.     Breath sounds: Normal breath sounds. No wheezing.  Abdominal:     General: Bowel sounds are normal. There is no distension or abdominal bruit.     Palpations: Abdomen is soft. There is no hepatomegaly or splenomegaly.     Tenderness: There is no abdominal tenderness. There is no right CVA tenderness or left CVA tenderness.     Hernia: No hernia is present.  Musculoskeletal:     Cervical back: Normal, normal range of motion and neck supple.     Thoracic back: Spasms and tenderness present. No swelling, edema, deformity, signs of trauma, lacerations or bony tenderness. Decreased range of motion. Scoliosis present.     Lumbar back: Normal.       Back:     Right lower leg: No edema.     Left lower leg: No edema.  Lymphadenopathy:     Cervical: No cervical adenopathy.  Skin:    General: Skin is warm and dry.     Capillary Refill: Capillary refill takes less than 2 seconds.      Coloration: Skin is not cyanotic, jaundiced or pale.     Findings: No rash.  Neurological:     General: No focal deficit present.     Mental Status: She is alert. Mental status is at baseline.     Cranial Nerves: Cranial nerves are intact. No cranial nerve deficit.     Sensory: Sensation is intact. No sensory deficit.     Motor: Motor function is intact. No weakness.     Coordination: Coordination is intact. Coordination normal.     Gait: Gait is intact. Gait normal.     Deep Tendon Reflexes: Reflexes are normal and symmetric. Reflexes normal.  Psychiatric:        Attention and Perception: Attention and perception normal.        Mood and Affect: Mood and affect normal.        Speech: Speech normal.        Behavior: Behavior normal. Behavior is cooperative.        Thought Content: Thought content normal.        Cognition and Memory: Cognition and memory normal.        Judgment: Judgment normal.     BP (!) 100/56   Pulse 68   Temp 99.1 F (37.3 C)   Resp 20   Ht 5\' 4"  (1.626 m)   Wt 127 lb (57.6 kg)   SpO2 97%   BMI 21.80 kg/m  Wt Readings from Last 3 Encounters:  09/29/19 127 lb (57.6 kg)  07/23/19 127 lb 3.2 oz (57.7 kg)  03/09/19 120 lb 6.4 oz (54.6 kg)    Health Maintenance Due  Topic Date Due  . URINE MICROALBUMIN  Never done  . DEXA SCAN  Never done    There are no preventive care reminders to display for this patient.   No results found for: TSH Lab Results  Component Value Date   WBC 6.7 02/13/2018   HGB 13.1 02/13/2018   HCT 39.2 02/13/2018   MCV 95.5 02/13/2018   PLT 300.0 02/13/2018   Lab Results  Component Value Date  NA 141 02/13/2018   K 4.0 02/13/2018   CO2 29 02/13/2018   GLUCOSE 121 (H) 02/13/2018   BUN 18 02/13/2018   CREATININE 0.86 02/13/2018   BILITOT 0.5 02/13/2018   ALKPHOS 63 02/13/2018   AST 17 02/13/2018   ALT 16 02/13/2018   PROT 6.7 02/13/2018   ALBUMIN 4.1 02/13/2018   CALCIUM 9.2 02/13/2018   ANIONGAP 10 09/09/2017    GFR 67.38 02/13/2018   X-Ray: thoracic spine: degenerative changes, no acute findings. Preliminary x-ray reading by Kari Baars, FNP-C, [facility].     Assessment & Plan:  Kylar was seen today for back pain.  Diagnoses and all orders for this visit:  Mid back pain on left side Imaging without acute findings. Will notify pt if radiology reading differs. Voltaren gel as prescribed. Report any new, worsening, or persistent symptoms.  -     DG Thoracic Spine 2 View; Future -     diclofenac Sodium (VOLTAREN) 1 % GEL; Apply 4 g topically 4 (four) times daily.  Muscle spasm Spasm of left thoracic Paraspinous muscles noted. Can take Robaxin as needed and as prescribed. Voltaren gel and moist heat. Report any new, worsening, or persistent symptoms.  -     diclofenac Sodium (VOLTAREN) 1 % GEL; Apply 4 g topically 4 (four) times daily.    Return if symptoms worsen or fail to improve.  The above assessment and management plan was discussed with the patient. The patient verbalized understanding of and has agreed to the management plan. Patient is aware to call the clinic if they develop any new symptoms or if symptoms fail to improve or worsen. Patient is aware when to return to the clinic for a follow-up visit. Patient educated on when it is appropriate to go to the emergency department.   Kari Baars, FNP-C [facility] [address] [place], [place] [address] [phone]

## 2019-09-29 NOTE — Telephone Encounter (Signed)
Jaclyn Robertson with [facility]iology called report for t-spine x-ray done this morning  Mild superior endplate compression deformity at the thoracolumbar junction likely involving L1. Further evaluation can be performed with MRI, as clinically indicated.  Full report is in chart

## 2019-09-29 NOTE — Patient Instructions (Signed)

## 2019-09-30 ENCOUNTER — Telehealth: Payer: Self-pay | Admitting: Physician Assistant

## 2019-09-30 NOTE — Telephone Encounter (Signed)
Line busy

## 2019-10-02 NOTE — Telephone Encounter (Signed)
Aware of xray results 

## 2019-10-06 ENCOUNTER — Encounter: Payer: Self-pay | Admitting: Neurology

## 2019-10-06 ENCOUNTER — Ambulatory Visit (INDEPENDENT_AMBULATORY_CARE_PROVIDER_SITE_OTHER): Payer: PPO | Admitting: Neurology

## 2019-10-06 ENCOUNTER — Other Ambulatory Visit: Payer: Self-pay

## 2019-10-06 ENCOUNTER — Encounter: Payer: Self-pay | Admitting: Family Medicine

## 2019-10-06 ENCOUNTER — Ambulatory Visit (INDEPENDENT_AMBULATORY_CARE_PROVIDER_SITE_OTHER): Payer: PPO | Admitting: Family Medicine

## 2019-10-06 DIAGNOSIS — M546 Pain in thoracic spine: Secondary | ICD-10-CM | POA: Diagnosis not present

## 2019-10-06 DIAGNOSIS — G301 Alzheimer's disease with late onset: Secondary | ICD-10-CM | POA: Diagnosis not present

## 2019-10-06 DIAGNOSIS — F0280 Dementia in other diseases classified elsewhere without behavioral disturbance: Secondary | ICD-10-CM

## 2019-10-06 MED ORDER — ESCITALOPRAM OXALATE 10 MG PO TABS: 10.0000 mg | ORAL_TABLET | Freq: Every day | ORAL | 3 refills | Status: DC

## 2019-10-06 MED ORDER — VITAMIN D-3 125 MCG (5000 UT) PO TABS: 1.0000 | ORAL_TABLET | Freq: Every day | ORAL | 3 refills | Status: AC

## 2019-10-06 MED ORDER — TRAMADOL HCL 50 MG PO TABS: 25.0000 mg | ORAL_TABLET | Freq: Two times a day (BID) | ORAL | 0 refills | Status: DC | PRN

## 2019-10-06 MED ORDER — DONEPEZIL HCL 10 MG PO TABS: ORAL_TABLET | ORAL | 3 refills | Status: DC

## 2019-10-06 MED ORDER — VITAMIN K2 100 MCG PO TABS: 100.0000 ug | ORAL_TABLET | Freq: Every day | ORAL | 3 refills | Status: AC

## 2019-10-06 MED ORDER — MAGNESIUM 400 MG PO CAPS: 400.0000 mg | ORAL_CAPSULE | Freq: Every day | ORAL | 1 refills | Status: DC

## 2019-10-06 MED ORDER — MEMANTINE HCL 10 MG PO TABS: 10.0000 mg | ORAL_TABLET | Freq: Two times a day (BID) | ORAL | 3 refills | Status: DC

## 2019-10-06 MED ORDER — CALCITONIN (SALMON) 200 UNIT/ACT NA SOLN: 1.0000 | Freq: Every day | NASAL | 3 refills | Status: DC

## 2019-10-06 NOTE — Patient Instructions (Signed)
    Diagnosis:  L1 compression fracture

## 2019-10-06 NOTE — Progress Notes (Signed)
NEUROLOGY FOLLOW UP OFFICE NOTE  Jaclyn Robertson [id number] [birth year]  HISTORY OF PRESENT ILLNESS: I had the pleasure of seeing Jaclyn Robertson in follow-up in the neurology clinic on 10/06/2019.  The patient was last seen 7 months ago for Alzheimer's dementia.  She is accompanied by her husband to help supplement the history today. MMSE 18/30 in August 2020.  She is taking Donepezil 10 mg daily and Memantine 10 mg twice daily without side effects.  She is also on Lexapro 10 mg daily for mood.  Since her last visit, she states her memory is pretty good.  Her husband reports worsening memory, she asks several times a day what the day years or where they are going.  Her husband manages medications, finances, and meals.  She is independent with dressing and bathing.  She denies any headaches, dizziness, vision changes, focal numbness/tingling/weakness. She has had some bowel incontinence and refuses to wear Depends.  She denies any falls, however her husband reminds her that she has fallen twice in the past 3 to 4 months, both times tripping over something.  Her husband denies any paranoia or hallucinations.  She was started on Seroquel in December, there are no records regarding this, her husband does not think she needs this.  She can get a little frustrated with herself, but there is no significant agitation.  History on Initial Assessment 08/22/2016: This is a 85 yo LH woman with worsening memory. Her husband stated noticing changes a couple of years ago, she would sometimes ask the same questions repeatedly. She talked to her prior PCP about this last fall, she would mostly have difficulty remembering names, schedules. She would get lost driving, forgetting she was supposed to turn. This has happened more in unfamiliar roads, but sometimes she has missed a turn going home. She denies any missed bill payments, no missed medications. She continues to cook without leaving the stove on. She misplaces things  frequently. She has been told she repeats herself, but reports that hearing is also a problem. She has noticed more word-finding difficulties in the past few months. 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 She has been taking Aricept 10mg  daily and has had "wild crazy dreams" at night. She states Xanax was started for the dreams. She denies any anxiety but her husband states she gets upset at herself easily. No paranoia or hallucinations. She denies any significant head injuries. No family history of dementia. She drinks 1-2 glasses of wine every night.   Records and images were personally reviewed where available. She underwent Neuropsychological testing in January 2019 with results indicating mild dementia, most likely due to Alzheimer's disease. Her cognitive profile was very concerning for medial temporal lobe involvement. MRI brain without contrast done 09/25/16 did not show any acute changes, there was moderate diffuse atrophy, mild chronic microvascular disease.   PAST MEDICAL HISTORY: Past Medical History:  Diagnosis Date  . Dementia [facility])    Outpatient Encounter Medications as of 10/06/2019  Medication Sig  . diclofenac Sodium (VOLTAREN) 1 % GEL Apply 4 g topically 4 (four) times daily.  10/08/2019 donepezil (ARICEPT) 10 MG tablet Take 1 tablet daily  . escitalopram (LEXAPRO) 10 MG tablet Take 1 tablet (10 mg total) by mouth daily.  . memantine (NAMENDA) 10 MG  tablet Take 1 tablet (10 mg total) by mouth 2 (two) times daily.  . methocarbamol (ROBAXIN) 500 MG tablet Take 1 tablet (500 mg total) by mouth every 8 (eight) hours as needed for muscle spasms.  . raloxifene (EVISTA) 60 MG tablet Take 60 mg by mouth once daily                  . calcitonin, salmon, (MIACALCIN) 200 UNIT/ACT nasal spray  Place 1 spray into alternate nostrils daily.  . Cholecalciferol (VITAMIN D-3) 125 MCG (5000 UT) TABS Take 1 tablet by mouth daily.  . Magnesium 400 MG CAPS Take 400 mg by mouth daily.  . Menatetrenone (VITAMIN K2) 100 MCG TABS Take 100 mcg by mouth daily.  . traMADol (ULTRAM) 50 MG tablet Take 0.5-1 tablets (25-50 mg total) by mouth 2 (two) times daily as needed.   No facility-administered encounter medications on file as of 10/06/2019.    ALLERGIES: Allergies  Allergen Reactions  . Levaquin  [Levofloxacin In D5w] Rash    FAMILY HISTORY: Family History  Problem Relation Age of Onset  . Seizures Mother     SOCIAL HISTORY: Social History   Socioeconomic History  . Marital status: Married    Spouse name: Not on file  . Number of children: Not on file  . Years of education: Not on file  . Highest education level: Not on file  Occupational History  . Not on file  Tobacco Use  . Smoking status: Never Smoker  . Smokeless tobacco: Never Used  Substance and Sexual Activity  . Alcohol use: Yes    Alcohol/week: 2.0 standard drinks    Types: 2 Glasses of wine per week    Comment: Occ.  . Drug use: No  . Sexual activity: Not Currently    Birth control/protection: Post-menopausal  Other Topics Concern  . Not on file  Social History Narrative   Lives with husband in a two story home      Left handed      Highest level of edu- bachelor's   Social Determinants of Health   Financial Resource Strain:   . Difficulty of Paying Living Expenses:   Food Insecurity:   . Worried About Charity fundraiser in the Last Year:   . Arboriculturist in the Last Year:   Transportation Needs:   . Film/video editor (Medical):   Marland Kitchen Lack of Transportation (Non-Medical):   Physical Activity:   . Days of Exercise per Week:   . Minutes of Exercise per Session:   Stress:   . Feeling of Stress :   Social Connections:   . Frequency of Communication with Friends and Family:   . Frequency of  Social Gatherings with Friends and Family:   . Attends Religious Services:   . Active Member of Clubs or Organizations:   . Attends Archivist Meetings:   Marland Kitchen Marital Status:   Intimate Partner Violence:   . Fear of Current or Ex-Partner:   . Emotionally Abused:   Marland Kitchen Physically Abused:   . Sexually Abused:     REVIEW OF SYSTEMS: Constitutional: No fevers, chills, or sweats, no generalized fatigue, change in appetite Eyes: No visual changes, double vision, eye pain Ear, nose and throat: No hearing loss, ear pain, nasal congestion, sore throat Cardiovascular: No chest pain, palpitations Respiratory:  No shortness of breath at rest or with exertion, wheezes GastrointestinaI: No nausea, vomiting, diarrhea, abdominal pain, fecal incontinence Genitourinary:  No dysuria, urinary  retention or frequency Musculoskeletal:  No neck pain,+ back pain Integumentary: No rash, pruritus, skin lesions Neurological: as above Psychiatric: No depression, insomnia, anxiety Endocrine: No palpitations, fatigue, diaphoresis, mood swings, change in appetite, change in weight, increased thirst Hematologic/Lymphatic:  No anemia, purpura, petechiae. Allergic/Immunologic: no itchy/runny eyes, nasal congestion, recent allergic reactions, rashes  PHYSICAL EXAM: Vitals:   10/06/19 1530  BP: 128/72  Pulse: (!) 49  SpO2: 100%   General: No acute distress Skin/Extremities: No rash, no edema Neurological Exam: alert and oriented to person, place, and time. No aphasia or dysarthria. Fund of knowledge is appropriate.  Recent and remote memory are impaired.Cranial nerves: Pupils equal, round, reactive to light.  Extraocular movements intact with no nystagmus. Visual fields full. No facial asymmetry.  Motor: Bulk and tone normal, muscle strength 5/5 throughout with no pronator drift.   Finger to nose testing intact.  Gait slow and cautious due to back pain.  IMPRESSION: This is an 85 yo LH woman with worsening  memory, with mild to moderate dementia likely due to Alzheimer's disease. MMSE in August 2020 was 18/30 (23/30 in Nov 2019). Continue Donepezil 10mg  daily and Memantine 10mg  BID.  Mood overall stable on Lexapro 10 mg daily.  Her husband does not report any significant agitation paranoia or hallucinations, we have agreed to hold off on Seroquel for now.  She was encouraged to wear Depends. Continue close supervision.  She does not drive.  Follow-up in 6 months, they know to call for any questions.   Thank you for allowing me to participate in her care.  Please do not hesitate to call for any questions or concerns.   , M.D.   CC: , PA-C

## 2019-10-06 NOTE — Patient Instructions (Addendum)
Good seeing you! Continue all your medications except for the Quetiapine (Seroquel). Follow-up in 6 months, call for any changes.  FALL PRECAUTIONS: Be cautious when walking. Scan the area for obstacles that may increase the risk of trips and falls. When getting up in the mornings, sit up at the edge of the bed for a few minutes before getting out of bed. Consider elevating the bed at the head end to avoid drop of blood pressure when getting up. Walk always in a well-lit room (use night lights in the walls). Avoid area rugs or power cords from appliances in the middle of the walkways. Use a walker or a cane if necessary and consider physical therapy for balance exercise. Get your eyesight checked regularly.   HOME SAFETY: Consider the safety of the kitchen when operating appliances like stoves, microwave oven, and blender. Consider having supervision and share cooking responsibilities until no longer able to participate in those. Accidents with firearms and other hazards in the house should be identified and addressed as well.   ABILITY TO BE LEFT ALONE: If patient is unable to contact 911 operator, consider using LifeLine, or when the need is there, arrange for someone to stay with patients. Smoking is a fire hazard, consider supervision or cessation. Risk of wandering should be assessed by caregiver and if detected at any point, supervision and safe proof recommendations should be instituted.  RECOMMENDATIONS FOR ALL PATIENTS WITH MEMORY PROBLEMS: 1. Continue to exercise (Recommend 30 minutes of walking everyday, or 3 hours every week) 2. Increase social interactions - continue going to [place] and enjoy social gatherings with friends and family 3. Eat healthy, avoid fried foods and eat more fruits and vegetables 4. Maintain adequate blood pressure, blood sugar, and blood cholesterol level. Reducing the risk of stroke and cardiovascular disease also helps promoting better memory. 5. Avoid stressful  situations. Live a simple life and avoid aggravations. Organize your time and prepare for the next day in anticipation. 6. Sleep well, avoid any interruptions of sleep and avoid any distractions in the bedroom that may interfere with adequate sleep quality 7. Avoid sugar, avoid sweets as there is a strong link between excessive sugar intake, diabetes, and cognitive impairment The Mediterranean diet has been shown to help patients reduce the risk of progressive memory disorders and reduces cardiovascular risk. This includes eating fish, eat fruits and green leafy vegetables, nuts like almonds and hazelnuts, walnuts, and also use olive oil. Avoid fast foods and fried foods as much as possible. Avoid sweets and sugar as sugar use has been linked to worsening of memory function.  There is always a concern of gradual progression of memory problems. If this is the case, then we may need to adjust level of care according to patient needs. Support, both to the patient and caregiver, should then be put into place.

## 2019-10-06 NOTE — Progress Notes (Signed)
Office Visit Note   Patient: Jaclyn Robertson           Date of Birth: 05/19/1935           MRN: [id number] Visit Date: 10/06/2019 Requested by: Baruch Gouty, [place],  [place] [address] PCP: Theodoro Clock  Subjective: Chief Complaint  Patient presents with  . Middle Back - Pain    Pain both sides of middle back x 3-4 weeks. Fell 2&1/2 months ago (fell face first). Diclofenac gel 3-4 x day - seems to help. Pain is worse first thing in the morning.    HPI: She is here with lower back pain.  She states that about 2-1/2 months ago she was outside and lost her balance and fell backward hitting her head.  She did not have any back pain at that time.  A couple weeks later she fell in the house face first.  At first she did not feel like anything was hurt, but later her lower back began bothering her and it has continued to hurt since then.  Recently she went to her PCP who took x-rays and she now presents for evaluation.  No previous problems with her back but she does have a history of osteoporosis for which she takes Evista.  She has dementia.  She is here with her husband today.               ROS: No bowel or bladder dysfunction related to her back pain.  No fevers or chills.  All other systems were reviewed and are negative.  Objective: Vital Signs: There were no vitals taken for this visit.  Physical Exam:  General:  Alert and oriented, in no acute distress. Pulm:  Breathing unlabored. Psy:  Normal mood, congruent affect. Skin: No visible rash. Low back: She is tender to palpation near the L1-2 level in the midline.  No significant pain above or below that.  Straight leg raise is negative, lower extremity strength and reflexes are normal.  Imaging: None today but recent x-rays viewed on computer show an L1 compression deformity of about 25%.  Assessment & Plan: 1.  Lower back pain 2 months status post fall due to subacute L1 compression fracture -We  discussed treatment options and elected to treat with calcitonin nasal spray, tramadol as needed, vitamin D3, vitamin K2 and magnesium. -Anticipate 4-8 more weeks healing time.  If she fails to progress, then contemplate MRI scan. -We discussed a lumbar corset brace but she would prefer not to wear 1.     Procedures: No procedures performed  No notes on file     PMFS History: Patient Active Problem List   Diagnosis Date Noted  . Compression deformity of vertebra 09/29/2019  . Late onset Alzheimer's disease without behavioral disturbance ([facility]) 11/18/2017  . Mild cognitive impairment 08/23/2016   Past Medical History:  Diagnosis Date  . Dementia ([facility])     Family History  Problem Relation Age of Onset  . Seizures Mother     Past Surgical History:  Procedure Laterality Date  . CATARACT EXTRACTION W/PHACO Left 09/13/2017   Procedure: CATARACT EXTRACTION PHACO AND INTRAOCULAR LENS PLACEMENT (IOC);  Surgeon: Baruch Goldmann, MD;  Location: [facility];  Service: Ophthalmology;  Laterality: Left;  CDE: 4.81  . CATARACT EXTRACTION W/PHACO Right 11/15/2017   Procedure: CATARACT EXTRACTION PHACO AND INTRAOCULAR LENS PLACEMENT (IOC);  Surgeon: Baruch Goldmann, MD;  Location: [facility];  Service: Ophthalmology;  Laterality:  Right;  CDE: 5.96   Social History   Occupational History  . Not on file  Tobacco Use  . Smoking status: Never Smoker  . Smokeless tobacco: Never Used  Substance and Sexual Activity  . Alcohol use: Yes    Alcohol/week: 2.0 standard drinks    Types: 2 Glasses of wine per week    Comment: Occ.  . Drug use: No  . Sexual activity: Not Currently    Birth control/protection: Post-menopausal

## 2019-10-15 ENCOUNTER — Telehealth: Payer: Self-pay

## 2019-10-15 NOTE — Telephone Encounter (Signed)
We had received a fax last week from the pharmacy because the Miacalcin nasal spray was not accepted by her insurance. Dr. Prince Rome said there is nothing else similar for compression fracture pain. The pharmacist said the patient did self pay for the Rx ($35) for the first medicine fill. I advised the pharmacist we have been unable to obtain the prior authorization, so the patient will have to continue to pay for this without the insurance.

## 2019-10-21 ENCOUNTER — Telehealth: Payer: Self-pay | Admitting: Neurology

## 2019-10-21 NOTE — Telephone Encounter (Signed)
Patient's spouse called wanting to know if any of the patient's medications could be causing her loose stools she has been having for about 4 months.

## 2019-10-22 NOTE — Telephone Encounter (Signed)
The Aricept (Donepezil) can cause diarrhea in some patients. Stop Aricept and see if loose stools quiet down within 1-2 weeks. If not, then it is not the medication. Thanks

## 2019-10-22 NOTE — Telephone Encounter (Signed)
Spoke with patients husband and advised to stop Aricept for 1-2 weeks and see if loose stools stops, if not to call and it is not the medication.

## 2019-10-29 ENCOUNTER — Encounter: Payer: Self-pay | Admitting: Family Medicine

## 2019-10-29 ENCOUNTER — Ambulatory Visit (INDEPENDENT_AMBULATORY_CARE_PROVIDER_SITE_OTHER): Payer: PPO | Admitting: Family Medicine

## 2019-10-29 ENCOUNTER — Other Ambulatory Visit: Payer: Self-pay

## 2019-10-29 VITALS — BP 127/75 | HR 62 | Temp 97.9°F | Resp 20 | Ht 64.0 in | Wt 123.5 lb

## 2019-10-29 DIAGNOSIS — F0280 Dementia in other diseases classified elsewhere without behavioral disturbance: Secondary | ICD-10-CM | POA: Diagnosis not present

## 2019-10-29 DIAGNOSIS — G301 Alzheimer's disease with late onset: Secondary | ICD-10-CM | POA: Diagnosis not present

## 2019-10-29 DIAGNOSIS — M810 Age-related osteoporosis without current pathological fracture: Secondary | ICD-10-CM | POA: Diagnosis not present

## 2019-11-02 ENCOUNTER — Encounter: Payer: Self-pay | Admitting: Family Medicine

## 2019-11-02 DIAGNOSIS — M810 Age-related osteoporosis without current pathological fracture: Secondary | ICD-10-CM | POA: Insufficient documentation

## 2019-11-02 MED ORDER — RALOXIFENE HCL 60 MG PO TABS: ORAL_TABLET | ORAL | 3 refills | Status: AC

## 2019-11-02 NOTE — Progress Notes (Signed)
Subjective:  Patient ID: Jaclyn Robertson, female    DOB: [birth year]  Age: 85 y.o. MRN: [id number]  CC: No chief complaint on file.   HPI Jaclyn Robertson presents for transfer of care from [name].  She is followed for dementia.  Her husband gives much of the history today.  She is tolerating her current medication.  But had trouble with the donepezil.  Dr. Karel Jarvis of neurology follows her for this and has prescribed memantine and Lexapro.  She is not currently taking the tramadol.  They would rather discontinue it and to do a pain contract.  It just is not that necessary for her.  She is noted to have had osteoporosis in the past and is due for a bone density.  She also takes Evista and vitamin D for this problem.  Depression screen [facility] 2/9 10/29/2019 09/29/2019 07/23/2019  Decreased Interest 0 0 0  Down, Depressed, Hopeless 0 0 0  PHQ - 2 Score 0 0 0    History Jaclyn Robertson has a past medical history of Dementia (HCC).   She has a past surgical history that includes Cataract extraction w/PHACO (Left, 09/13/2017) and Cataract extraction w/PHACO (Right, 11/15/2017).   Her family history includes Seizures in her mother.She reports that she has never smoked. She has never used smokeless tobacco. She reports current alcohol use of about 2.0 standard drinks of alcohol per week. She reports that she does not use drugs.    ROS Review of Systems  Constitutional: Negative.   HENT: Negative for congestion.   Eyes: Negative for visual disturbance.  Respiratory: Negative for shortness of breath.   Cardiovascular: Negative for chest pain.  Gastrointestinal: Negative for abdominal pain, constipation, diarrhea, nausea and vomiting.  Genitourinary: Negative for difficulty urinating.  Musculoskeletal: Negative for arthralgias and myalgias.  Neurological: Negative for headaches.  Psychiatric/Behavioral: Negative for sleep disturbance.    Objective:  BP 127/75   Pulse 62   Temp 97.9 F (36.6 C) (Oral)    Resp 20   Ht 5\' 4"  (1.626 m)   Wt 123 lb 8 oz (56 kg)   SpO2 98%   BMI 21.20 kg/m   BP Readings from Last 3 Encounters:  10/29/19 127/75  10/06/19 128/72  09/29/19 (!) 100/56    Wt Readings from Last 3 Encounters:  10/29/19 123 lb 8 oz (56 kg)  10/06/19 128 lb 12.8 oz (58.4 kg)  09/29/19 127 lb (57.6 kg)     Physical Exam Constitutional:      General: She is not in acute distress.    Appearance: She is well-developed.  HENT:     Head: Normocephalic and atraumatic.     Right Ear: External ear normal.     Left Ear: External ear normal.     Nose: Nose normal.  Eyes:     Conjunctiva/sclera: Conjunctivae normal.     Pupils: Pupils are equal, round, and reactive to light.  Neck:     Thyroid: No thyromegaly.  Cardiovascular:     Rate and Rhythm: Normal rate and regular rhythm.     Heart sounds: Normal heart sounds. No murmur.  Pulmonary:     Effort: Pulmonary effort is normal. No respiratory distress.     Breath sounds: Normal breath sounds. No wheezing or rales.  Abdominal:     General: Bowel sounds are normal. There is no distension.     Palpations: Abdomen is soft.     Tenderness: There is no abdominal tenderness.  Musculoskeletal:  Cervical back: Normal range of motion and neck supple.  Lymphadenopathy:     Cervical: No cervical adenopathy.  Skin:    General: Skin is warm and dry.  Neurological:     Mental Status: She is alert and oriented to person, place, and time.     Deep Tendon Reflexes: Reflexes are normal and symmetric.  Psychiatric:        Behavior: Behavior normal.        Thought Content: Thought content normal.        Judgment: Judgment normal.       Assessment & Plan:   Diagnoses and all orders for this visit:  Late onset Alzheimer's disease without behavioral disturbance (HCC)  Age-related osteoporosis without current pathological fracture -     raloxifene (EVISTA) 60 MG tablet; Take 60 mg by mouth once daily    She still has some  pain from her recent compression fracture but is doing much better.  Will hold off on tramadol for the time being.  Can be renewed if needed.  If it becomes chronic will execute pain agreement.   I have discontinued Beatriz Chancellor. Dauenhauer's methocarbamol, diclofenac Sodium, calcitonin (salmon), traMADol, and donepezil. I am also having her maintain her Vitamin D-3, Vitamin K2, Magnesium, escitalopram, memantine, and raloxifene.  Allergies as of 10/29/2019      Reactions   Levaquin  [levofloxacin In D5w] Rash      Medication List       Accurate as of October 29, 2019 11:59 PM. If you have any questions, ask your nurse or doctor.        STOP taking these medications   calcitonin (salmon) 200 UNIT/ACT nasal spray Commonly known as: Miacalcin Stopped by: Claretta Fraise, MD   diclofenac Sodium 1 % Gel Commonly known as: Voltaren Stopped by: Claretta Fraise, MD   donepezil 10 MG tablet Commonly known as: ARICEPT Stopped by: Claretta Fraise, MD   methocarbamol 500 MG tablet Commonly known as: Robaxin Stopped by: Claretta Fraise, MD   traMADol 50 MG tablet Commonly known as: ULTRAM Stopped by: Claretta Fraise, MD     TAKE these medications   escitalopram 10 MG tablet Commonly known as: LEXAPRO Take 1 tablet (10 mg total) by mouth daily.   Magnesium 400 MG Caps Take 400 mg by mouth daily.   memantine 10 MG tablet Commonly known as: NAMENDA Take 1 tablet (10 mg total) by mouth 2 (two) times daily.   raloxifene 60 MG tablet Commonly known as: EVISTA Take 60 mg by mouth once daily   Vitamin D-3 125 MCG (5000 UT) Tabs Take 1 tablet by mouth daily.   Vitamin K2 100 MCG Tabs Take 100 mcg by mouth daily.        Follow-up: Return in about 3 months (around 01/28/2020).  Claretta Fraise, M.D.

## 2020-02-01 ENCOUNTER — Ambulatory Visit (INDEPENDENT_AMBULATORY_CARE_PROVIDER_SITE_OTHER): Payer: PPO | Admitting: Family Medicine

## 2020-02-01 ENCOUNTER — Other Ambulatory Visit: Payer: Self-pay

## 2020-02-01 ENCOUNTER — Encounter: Payer: Self-pay | Admitting: Family Medicine

## 2020-02-01 VITALS — BP 124/82 | HR 61 | Temp 97.2°F | Resp 20 | Ht 64.0 in | Wt 120.0 lb

## 2020-02-01 DIAGNOSIS — M810 Age-related osteoporosis without current pathological fracture: Secondary | ICD-10-CM | POA: Diagnosis not present

## 2020-02-01 DIAGNOSIS — G301 Alzheimer's disease with late onset: Secondary | ICD-10-CM | POA: Diagnosis not present

## 2020-02-01 DIAGNOSIS — F0280 Dementia in other diseases classified elsewhere without behavioral disturbance: Secondary | ICD-10-CM | POA: Diagnosis not present

## 2020-02-01 MED ORDER — RIVASTIGMINE 4.6 MG/24HR TD PT24: 4.6000 mg | MEDICATED_PATCH | Freq: Every day | TRANSDERMAL | 12 refills | Status: DC

## 2020-02-01 NOTE — Progress Notes (Signed)
 Subjective:  Patient ID: Jaclyn Robertson, female    DOB: 02/11/1935  Age: 85 y.o. MRN: [id number]  CC: Medical Management of Chronic Issues   HPI [name] presents for follow-up on her dementia.  Her husband gives most of the history.  She exhibits a lack of memory of recent events.  She is unaware that she takes any kind of medications.  Husband assures us that she is taking the ones on her medication list.  He feels that she is not responding well to the Namenda and that her memory is not improved.  She is stable on her antidepressant Lexapro and she states she is comfortable staying on it.  Husband confirms that.  He confirms also that she continues to take Evista for osteoporosis.  Depression screen PHQ 2/9 02/01/2020 10/29/2019 09/29/2019  Decreased Interest 0 0 0  Down, Depressed, Hopeless 0 0 0  PHQ - 2 Score 0 0 0    History [name] has a past medical history of Dementia (HCC).   She has a past surgical history that includes Cataract extraction w/PHACO (Left, 09/13/2017) and Cataract extraction w/PHACO (Right, 11/15/2017).   Her family history includes Seizures in her mother.She reports that she has never smoked. She has never used smokeless tobacco. She reports current alcohol use of about 2.0 standard drinks of alcohol per week. She reports that she does not use drugs.    ROS Review of Systems  Constitutional: Negative.   HENT: Negative.   Eyes: Negative for visual disturbance.  Respiratory: Negative for shortness of breath.   Cardiovascular: Negative for chest pain.  Gastrointestinal: Negative for abdominal pain.  Musculoskeletal: Negative for arthralgias.    Objective:  BP 124/82   Pulse 61   Temp (!) 97.2 F (36.2 C) (Temporal)   Resp 20   Ht 5' 4" (1.626 m)   Wt 120 lb (54.4 kg)   SpO2 98%   BMI 20.60 kg/m   BP Readings from Last 3 Encounters:  02/01/20 124/82  10/29/19 127/75  10/06/19 128/72    Wt Readings from Last 3 Encounters:  02/01/20 120 lb  (54.4 kg)  10/29/19 123 lb 8 oz (56 kg)  10/06/19 128 lb 12.8 oz (58.4 kg)     Physical Exam Constitutional:      General: She is not in acute distress.    Appearance: She is well-developed.  HENT:     Head: Normocephalic and atraumatic.     Right Ear: External ear normal.     Left Ear: External ear normal.     Nose: Nose normal.  Eyes:     Conjunctiva/sclera: Conjunctivae normal.     Pupils: Pupils are equal, round, and reactive to light.  Neck:     Thyroid: No thyromegaly.  Cardiovascular:     Rate and Rhythm: Normal rate and regular rhythm.     Heart sounds: Normal heart sounds. No murmur heard.   Pulmonary:     Effort: Pulmonary effort is normal. No respiratory distress.     Breath sounds: Normal breath sounds. No wheezing or rales.  Abdominal:     General: Bowel sounds are normal. There is no distension.     Palpations: Abdomen is soft.     Tenderness: There is no abdominal tenderness.  Musculoskeletal:     Cervical back: Normal range of motion and neck supple.  Lymphadenopathy:     Cervical: No cervical adenopathy.  Skin:    General: Skin is warm and dry.  Neurological:       Mental Status: She is alert and oriented to person, place, and time.     Deep Tendon Reflexes: Reflexes are normal and symmetric.  Psychiatric:        Behavior: Behavior normal.        Thought Content: Thought content normal.        Judgment: Judgment normal.       Assessment & Plan:   Lurene was seen today for medical management of chronic issues.  Diagnoses and all orders for this visit:  Late onset Alzheimer's disease without behavioral disturbance (HCC) -     rivastigmine (EXELON) 4.6 mg/24hr; Place 1 patch (4.6 mg total) onto the skin daily. -     CBC with Differential/Platelet -     CMP14+EGFR -     Lipid panel  Age-related osteoporosis without current pathological fracture -     CBC with Differential/Platelet -     CMP14+EGFR -     Lipid panel       I am having  [name] start on rivastigmine. I am also having her maintain her Vitamin D-3, Vitamin K2, Magnesium, escitalopram, memantine, and raloxifene.  Allergies as of 02/01/2020      Reactions   Levaquin  [levofloxacin In D5w] Rash      Medication List       Accurate as of February 01, 2020  7:46 PM. If you have any questions, ask your nurse or doctor.        escitalopram 10 MG tablet Commonly known as: LEXAPRO Take 1 tablet (10 mg total) by mouth daily.   Magnesium 400 MG Caps Take 400 mg by mouth daily.   memantine 10 MG tablet Commonly known as: NAMENDA Take 1 tablet (10 mg total) by mouth 2 (two) times daily.   raloxifene 60 MG tablet Commonly known as: EVISTA Take 60 mg by mouth once daily   rivastigmine 4.6 mg/24hr Commonly known as: EXELON Place 1 patch (4.6 mg total) onto the skin daily. Started by:  , MD   Vitamin D-3 125 MCG (5000 UT) Tabs Take 1 tablet by mouth daily.   Vitamin K2 100 MCG Tabs Take 100 mcg by mouth daily.        Follow-up: Return in about 6 months (around 08/03/2020), or if symptoms worsen or fail to improve.   , M.D. 

## 2020-02-10 ENCOUNTER — Telehealth: Payer: Self-pay | Admitting: Neurology

## 2020-02-10 NOTE — Telephone Encounter (Signed)
Patient's husband called in wanting to find out about some medication the patient's PCP prescribed. It's a patch called Rivastigmine. He is concerned with side effects and how it would affect her Alzheimer's.

## 2020-02-10 NOTE — Telephone Encounter (Signed)
Spoke with pt husband informed him that  another option as medication to help slow down the worsening of memory in dementia patients. When patients have diarrhea on the oral medications, the patch is used since it does not go through the stomach like the pills. Would proceed with the patch as advised. Pt husband verbalized understanding, they will start the patch,

## 2020-02-10 NOTE — Telephone Encounter (Signed)
Pls let him know that it is another option as medication to help slow down the worsening of memory in dementia patients. When patients have diarrhea on the oral medications, the patch is used since it does not go through the stomach like the pills. Would proceed with the patch as advised, thanks

## 2020-02-29 ENCOUNTER — Telehealth: Payer: Self-pay | Admitting: Neurology

## 2020-02-29 NOTE — Telephone Encounter (Signed)
Yes pls, with resources at the front of folder for him. Also, pls enroll him in Direct Access through the [facility] (we have the forms) so they can contact him. Thanks

## 2020-02-29 NOTE — Telephone Encounter (Signed)
[employer] 02/29/20 @ 12:59pm:  "Caller states he is seeking information regarding Alzheimer's support groups for himself, his wife has Alzheimer's."

## 2020-03-01 NOTE — Telephone Encounter (Signed)
Spoke to Jaclyn Robertson informed him that packet was sent to him in the mail with support group information and that the alzheimer's assoc will be in contact with him,

## 2020-03-11 ENCOUNTER — Telehealth (INDEPENDENT_AMBULATORY_CARE_PROVIDER_SITE_OTHER): Payer: PPO | Admitting: Family Medicine

## 2020-03-11 ENCOUNTER — Other Ambulatory Visit: Payer: Self-pay

## 2020-03-11 DIAGNOSIS — R350 Frequency of micturition: Secondary | ICD-10-CM

## 2020-03-11 DIAGNOSIS — R300 Dysuria: Secondary | ICD-10-CM | POA: Diagnosis not present

## 2020-03-11 LAB — MICROSCOPIC EXAMINATION
Bacteria, UA: NONE SEEN
RBC, Urine: NONE SEEN /hpf (ref 0–2)

## 2020-03-11 LAB — URINALYSIS, COMPLETE
Bilirubin, UA: NEGATIVE
Glucose, UA: NEGATIVE
Nitrite, UA: NEGATIVE
Protein,UA: NEGATIVE
Specific Gravity, UA: 1.025 (ref 1.005–1.030)
Urobilinogen, Ur: 0.2 mg/dL (ref 0.2–1.0)
pH, UA: 5 (ref 5.0–7.5)

## 2020-03-11 NOTE — Progress Notes (Signed)
Telephone visit  Subjective: CC: ?UTI PCP: Mechele Claude, MD EPP:IRJJO Jaclyn Robertson is a 85 y.o. female calls for telephone consult today. Patient provides verbal consent for consult held via phone.  Due to COVID-19 pandemic this visit was conducted virtually. This visit type was conducted due to national recommendations for restrictions regarding the COVID-19 Pandemic (e.g. social distancing, sheltering in place) in an effort to limit this patient's exposure and mitigate transmission in our community. All issues noted in this document were discussed and addressed.  A physical exam was not performed with this format.   Location of patient: home Location of provider: [facility] Others present for call: spouse  1. Urinary symptoms History is provided by spouse, who reports frequent urination over the last week.  She has alzheimer's.  Denies hematuria, fevers, chills, abdominal pain, nausea, vomiting, back pain, vaginal discharge.     ROS: Per HPI  Allergies  Allergen Reactions  . Levaquin  [Levofloxacin In D5w] Rash   Past Medical History:  Diagnosis Date  . Dementia [facility])     Current Outpatient Medications:  .  Cholecalciferol (VITAMIN D-3) 125 MCG (5000 UT) TABS, Take 1 tablet by mouth daily., Disp: 90 tablet, Rfl: 3 .  escitalopram (LEXAPRO) 10 MG tablet, Take 1 tablet (10 mg total) by mouth daily., Disp: 90 tablet, Rfl: 3 .  Magnesium 400 MG CAPS, Take 400 mg by mouth daily., Disp: 90 capsule, Rfl: 1 .  memantine (NAMENDA) 10 MG tablet, Take 1 tablet (10 mg total) by mouth 2 (two) times daily., Disp: 180 tablet, Rfl: 3 .  Menatetrenone (VITAMIN K2) 100 MCG TABS, Take 100 mcg by mouth daily., Disp: 90 tablet, Rfl: 3 .  raloxifene (EVISTA) 60 MG tablet, Take 60 mg by mouth once daily, Disp: 90 tablet, Rfl: 3 .  rivastigmine (EXELON) 4.6 mg/24hr, Place 1 patch (4.6 mg total) onto the skin daily., Disp: 30 patch, Rfl: 12  Assessment/ Plan: 85 y.o. female   1. Frequent urination At this  time does not appear to be a urinary tract infection.  Urine microscopy without any bacteria noted.  She has no white blood cell count.  Negative nitrites.  We will send for culture given increased urinary frequency and Alzheimer's.  I discussed red flag signs and symptoms warranting further evaluation.  Her spouse voiced understanding will follow up as needed - Urinalysis, Complete - Urine Culture   Start time: 1:00pm End time: 1:02pm  Total time spent on patient care (including telephone call/ virtual visit): 7 minutes  Marice Guidone Hulen Skains, DO [facility] [phone]

## 2020-03-11 NOTE — Patient Instructions (Signed)
Urinary Tract Infection, Adult A urinary tract infection (UTI) is an infection of any part of the urinary tract. The urinary tract includes:  The kidneys.  The ureters.  The bladder.  The urethra. These organs make, store, and get rid of pee (urine) in the body. What are the causes? This is caused by germs (bacteria) in your genital area. These germs grow and cause swelling (inflammation) of your urinary tract. What increases the risk? You are more likely to develop this condition if:  You have a small, thin tube (catheter) to drain pee.  You cannot control when you pee or poop (incontinence).  You are female, and: ? You use these methods to prevent pregnancy:  A medicine that kills sperm (spermicide).  A device that blocks sperm (diaphragm). ? You have low levels of a female hormone (estrogen). ? You are pregnant.  You have genes that add to your risk.  You are sexually active.  You take antibiotic medicines.  You have trouble peeing because of: ? A prostate that is bigger than normal, if you are female. ? A blockage in the part of your body that drains pee from the bladder (urethra). ? A kidney stone. ? A nerve condition that affects your bladder (neurogenic bladder). ? Not getting enough to drink. ? Not peeing often enough.  You have other conditions, such as: ? Diabetes. ? A weak disease-fighting system (immune system). ? Sickle cell disease. ? Gout. ? Injury of the spine. What are the signs or symptoms? Symptoms of this condition include:  Needing to pee right away (urgently).  Peeing often.  Peeing small amounts often.  Pain or burning when peeing.  Blood in the pee.  Pee that smells bad or not like normal.  Trouble peeing.  Pee that is cloudy.  Fluid coming from the vagina, if you are female.  Pain in the belly or lower back. Other symptoms include:  Throwing up (vomiting).  No urge to eat.  Feeling mixed up (confused).  Being tired  and grouchy (irritable).  A fever.  Watery poop (diarrhea). How is this treated? This condition may be treated with:  Antibiotic medicine.  Other medicines.  Drinking enough water. Follow these instructions at home:  Medicines  Take over-the-counter and prescription medicines only as told by your doctor.  If you were prescribed an antibiotic medicine, take it as told by your doctor. Do not stop taking it even if you start to feel better. General instructions  Make sure you: ? Pee until your bladder is empty. ? Do not hold pee for a long time. ? Empty your bladder after sex. ? Wipe from front to back after pooping if you are a female. Use each tissue one time when you wipe.  Drink enough fluid to keep your pee pale yellow.  Keep all follow-up visits as told by your doctor. This is important. Contact a doctor if:  You do not get better after 1-2 days.  Your symptoms go away and then come back. Get help right away if:  You have very bad back pain.  You have very bad pain in your lower belly.  You have a fever.  You are sick to your stomach (nauseous).  You are throwing up. Summary  A urinary tract infection (UTI) is an infection of any part of the urinary tract.  This condition is caused by germs in your genital area.  There are many risk factors for a UTI. These include having a small, thin   tube to drain pee and not being able to control when you pee or poop.  Treatment includes antibiotic medicines for germs.  Drink enough fluid to keep your pee pale yellow. This information is not intended to replace advice given to you by your health care provider. Make sure you discuss any questions you have with your health care provider. Document Revised: 06/19/2018 Document Reviewed: 01/09/2018 Elsevier Patient Education  2020 Elsevier Inc.  

## 2020-03-13 LAB — URINE CULTURE

## 2020-05-03 ENCOUNTER — Ambulatory Visit: Payer: PPO | Admitting: Family Medicine

## 2020-05-12 ENCOUNTER — Other Ambulatory Visit: Payer: Self-pay | Admitting: Family Medicine

## 2020-05-13 ENCOUNTER — Ambulatory Visit: Payer: PPO | Admitting: Neurology

## 2020-05-13 ENCOUNTER — Encounter: Payer: Self-pay | Admitting: Neurology

## 2020-05-13 ENCOUNTER — Other Ambulatory Visit: Payer: Self-pay

## 2020-05-13 VITALS — BP 138/70 | HR 74 | Resp 18 | Ht 65.5 in | Wt 124.0 lb

## 2020-05-13 DIAGNOSIS — F0280 Dementia in other diseases classified elsewhere without behavioral disturbance: Secondary | ICD-10-CM

## 2020-05-13 DIAGNOSIS — G301 Alzheimer's disease with late onset: Secondary | ICD-10-CM

## 2020-05-13 MED ORDER — MEMANTINE HCL 10 MG PO TABS: 10.0000 mg | ORAL_TABLET | Freq: Two times a day (BID) | ORAL | 3 refills | Status: AC

## 2020-05-13 MED ORDER — ESCITALOPRAM OXALATE 20 MG PO TABS: 20.0000 mg | ORAL_TABLET | Freq: Every day | ORAL | 3 refills | Status: DC

## 2020-05-13 NOTE — Progress Notes (Signed)
NEUROLOGY FOLLOW UP OFFICE NOTE  Jaclyn Robertson [id number] 1934/12/09  HISTORY OF PRESENT ILLNESS: I had the pleasure of seeing Jaclyn Robertson in follow-up in the neurology clinic on 05/13/2020.  The patient was last seen 7 months ago for Alzheimer's dementia. She is again accompanied by her husband who helps supplement the history today. She states her memory could be better. Her husband has not noticed much change, but things are "just more intense." She kept asking about today's appointment. Her husband manages medications, finances, meals. Her daughter [name] also provided additional information. She has noticed a pretty significant decline in the past 6 months. She forgets her daughter and husband, referring to them as "husband, kids." She did not recognize her brother. She walks her dog daily and got lost once. She gets more confused and agitated in the late afternoon/evening house. Sleep is good. She is independent with dressing and bathing. She denies any headaches, dizziness, vision changes, focal numbness/tingling/weakness, no falls. She was having diarrhea on Donepezil, this resolved with stopping medication. She takes Memantine 10mg  BID. PCP prescribed Rivastigmine patch however he did not start it. She is also on Lexapro 10mg  daily.   History on Initial Assessment 08/22/2016: This is a 85 yo LH woman with worsening memory. Her husband stated noticing changes a couple of years ago, she would sometimes ask the same questions repeatedly. She talked to her prior PCP about this last fall, she would mostly have difficulty remembering names, schedules. She would get lost driving, forgetting she was supposed to turn. This has happened more in unfamiliar roads, but sometimes she has missed a turn going home. She denies any missed bill payments, no missed medications. She continues to cook without leaving the stove on. She misplaces things frequently. She has been told she repeats herself, but reports that  hearing is also a problem. She has noticed more word-finding difficulties in the past few months. 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 She has been taking Aricept 10mg  daily and has had "wild crazy dreams" at night. She states Xanax was started for the dreams. She denies any anxiety but her husband states she gets upset at herself easily. No paranoia or hallucinations. She denies any significant head injuries. No family history of dementia. She drinks 1-2 glasses of wine every night.   Records and images were personally reviewed where available. She underwent Neuropsychological testing in January 2019 with results indicating mild dementia, most likely due to Alzheimer's disease. Her cognitive profile was very concerning for medial temporal lobe involvement. MRI brain without contrast done 09/25/16 did not show any acute changes, there was moderate diffuse atrophy, mild chronic microvascular disease.   PAST MEDICAL HISTORY: Past Medical History:  Diagnosis Date  . Dementia [facility])    Outpatient Encounter Medications as of 05/13/2020  Medication Sig  . Cholecalciferol (VITAMIN D-3) 125 MCG (5000 UT) TABS Take 1 tablet by mouth daily.  . Magnesium Oxide 400 (240 Mg) MG TABS TAKE 1 TABLET DAILY  . memantine (NAMENDA) 10 MG tablet Take 1 tablet (10 mg total) by mouth 2 (two) times daily.  . Menatetrenone (VITAMIN K2) 100 MCG TABS Take 100 mcg by mouth daily.  . raloxifene (EVISTA) 60 MG tablet Take 60 mg by mouth once daily  .  escitalopram (  LEXAPRO) 10 MG tablet Take 1 tablet (10 mg total) by mouth daily.                   No facility-administered encounter medications on file as of 05/13/2020.    MEDICATIONS: Current Outpatient Medications on File Prior to Visit  Medication Sig Dispense  Refill  . Cholecalciferol (VITAMIN D-3) 125 MCG (5000 UT) TABS Take 1 tablet by mouth daily. 90 tablet 3  . Magnesium Oxide 400 (240 Mg) MG TABS TAKE 1 TABLET DAILY 90 tablet 0  . Menatetrenone (VITAMIN K2) 100 MCG TABS Take 100 mcg by mouth daily. 90 tablet 3  . raloxifene (EVISTA) 60 MG tablet Take 60 mg by mouth once daily 90 tablet 3  . [DISCONTINUED] Magnesium 400 MG CAPS Take 400 mg by mouth daily. 90 capsule 1   No current facility-administered medications on file prior to visit.    ALLERGIES: Allergies  Allergen Reactions  . Levaquin  [Levofloxacin In D5w] Rash    FAMILY HISTORY: Family History  Problem Relation Age of Onset  . Seizures Mother     SOCIAL HISTORY: Social History   Socioeconomic History  . Marital status: Married    Spouse name: Not on file  . Number of children: Not on file  . Years of education: Not on file  . Highest education level: Not on file  Occupational History  . Not on file  Tobacco Use  . Smoking status: Never Smoker  . Smokeless tobacco: Never Used  Vaping Use  . Vaping Use: Never used  Substance and Sexual Activity  . Alcohol use: Yes    Alcohol/week: 2.0 standard drinks    Types: 2 Glasses of wine per week    Comment: Occ.  . Drug use: No  . Sexual activity: Not Currently    Birth control/protection: Post-menopausal  Other Topics Concern  . Not on file  Social History Narrative   Lives with husband in a two story home      Left handed      Highest level of edu- bachelor's   Social Determinants of Health   Financial Resource Strain:   . Difficulty of Paying Living Expenses: Not on file  Food Insecurity:   . Worried About Programme researcher, broadcasting/film/video in the Last Year: Not on file  . Ran Out of Food in the Last Year: Not on file  Transportation Needs:   . Lack of Transportation (Medical): Not on file  . Lack of Transportation (Non-Medical): Not on file  Physical Activity:   . Days of Exercise per Week: Not on file  . Minutes  of Exercise per Session: Not on file  Stress:   . Feeling of Stress : Not on file  Social Connections:   . Frequency of Communication with Friends and Family: Not on file  . Frequency of Social Gatherings with Friends and Family: Not on file  . Attends Religious Services: Not on file  . Active Member of Clubs or Organizations: Not on file  . Attends Banker Meetings: Not on file  . Marital Status: Not on file  Intimate Partner Violence:   . Fear of Current or Ex-Partner: Not on file  . Emotionally Abused: Not on file  . Physically Abused: Not on file  . Sexually Abused: Not on file     PHYSICAL EXAM: Vitals:   05/13/20 1532  BP: 138/70  Pulse: 74  Resp: 18  SpO2: 98%   General: No acute distress  Head:  Normocephalic/atraumatic Skin/Extremities: No rash, no edema Neurological Exam: alert and oriented to person, place. No aphasia or dysarthria. Fund of knowledge is reduced.  Recent and remote memory are impaired.  Attention and concentration are normal.  MMSE 19/30 MMSE - Mini Mental State Exam 05/13/2020 03/03/2019 06/10/2018  Orientation to time 0 2 3  Orientation to Place 4 5 5   Registration 3 3 3   Attention/ Calculation 5 1 4   Recall 0 0 0  Language- name 2 objects 2 2 2   Language- repeat 1 1 1   Language- follow 3 step command 2 3 3   Language- read & follow direction 1 0 1  Write a sentence 1 1 1   Copy design 0 0 0  Total score 19 18 23     Cranial nerves: Pupils equal, round. Extraocular movements intact with no nystagmus. Visual fields full.  No facial asymmetry.  Motor: Bulk and tone normal, muscle strength 5/5 throughout with no pronator drift.   Finger to nose testing intact.  Gait narrow-based and steady, able to tandem walk adequately.  Romberg negative.   IMPRESSION: This is an 85 yo LH woman with worsening memory, with mild to moderate dementia likely due to Alzheimer's disease. MMSE today 19/30 18/30 in 02/2019).  in August 2020 was 18/30 (23/30  in Nov 2019). She had diarrhea on Donepezil, continue Memantine 10mg  BID. Family reporting more agitation, increase lexapro to 20mg  daily. They will try melatonin to help with sundowning, we can add on Buspirone for anxiety in the future if needed. Continue close supervision. She does not drive. Follow-up in 6-8 months, they know to call for any changes.   Thank you for allowing me to participate in her care.  Please do not hesitate to call for any questions or concerns.   97, M.D.   CC: Dr. 09-05-2006

## 2020-05-13 NOTE — Patient Instructions (Signed)
1. Increase Lexapro to 20mg  daily 2. Continue Memantine 10mg  twice a day 3. Try melatonin for sundowning, we can try Buspirone in the future if needed 4. Continue close supervision 5. Follow-up in 6-8 months, call for any changes   FALL PRECAUTIONS: Be cautious when walking. Scan the area for obstacles that may increase the risk of trips and falls. When getting up in the mornings, sit up at the edge of the bed for a few minutes before getting out of bed. Consider elevating the bed at the head end to avoid drop of blood pressure when getting up. Walk always in a well-lit room (use night lights in the walls). Avoid area rugs or power cords from appliances in the middle of the walkways. Use a walker or a cane if necessary and consider physical therapy for balance exercise. Get your eyesight checked regularly.  HOME SAFETY: Consider the safety of the kitchen when operating appliances like stoves, microwave oven, and blender. Consider having supervision and share cooking responsibilities until no longer able to participate in those. Accidents with firearms and other hazards in the house should be identified and addressed as well.  ABILITY TO BE LEFT ALONE: If patient is unable to contact 911 operator, consider using LifeLine, or when the need is there, arrange for someone to stay with patients. Smoking is a fire hazard, consider supervision or cessation. Risk of wandering should be assessed by caregiver and if detected at any point, supervision and safe proof recommendations should be instituted.  RECOMMENDATIONS FOR ALL PATIENTS WITH MEMORY PROBLEMS: 1. Continue to exercise (Recommend 30 minutes of walking everyday, or 3 hours every week) 2. Increase social interactions - continue going to [place] and enjoy social gatherings with friends and family 3. Eat healthy, avoid fried foods and eat more fruits and vegetables 4. Maintain adequate blood pressure, blood sugar, and blood cholesterol level. Reducing  the risk of stroke and cardiovascular disease also helps promoting better memory. 5. Avoid stressful situations. Live a simple life and avoid aggravations. Organize your time and prepare for the next day in anticipation. 6. Sleep well, avoid any interruptions of sleep and avoid any distractions in the bedroom that may interfere with adequate sleep quality 7. Avoid sugar, avoid sweets as there is a strong link between excessive sugar intake, diabetes, and cognitive impairment The Mediterranean diet has been shown to help patients reduce the risk of progressive memory disorders and reduces cardiovascular risk. This includes eating fish, eat fruits and green leafy vegetables, nuts like almonds and hazelnuts, walnuts, and also use olive oil. Avoid fast foods and fried foods as much as possible. Avoid sweets and sugar as sugar use has been linked to worsening of memory function.  There is always a concern of gradual progression of memory problems. If this is the case, then we may need to adjust level of care according to patient needs. Support, both to the patient and caregiver, should then be put into place.

## 2020-05-31 ENCOUNTER — Ambulatory Visit (INDEPENDENT_AMBULATORY_CARE_PROVIDER_SITE_OTHER): Payer: PPO | Admitting: Family Medicine

## 2020-05-31 ENCOUNTER — Other Ambulatory Visit: Payer: Self-pay

## 2020-05-31 ENCOUNTER — Encounter: Payer: Self-pay | Admitting: Family Medicine

## 2020-05-31 VITALS — BP 109/67 | HR 60 | Temp 97.2°F | Ht 65.5 in | Wt 124.6 lb

## 2020-05-31 DIAGNOSIS — F0280 Dementia in other diseases classified elsewhere without behavioral disturbance: Secondary | ICD-10-CM | POA: Diagnosis not present

## 2020-05-31 DIAGNOSIS — G301 Alzheimer's disease with late onset: Secondary | ICD-10-CM

## 2020-05-31 DIAGNOSIS — Z23 Encounter for immunization: Secondary | ICD-10-CM | POA: Diagnosis not present

## 2020-05-31 DIAGNOSIS — M810 Age-related osteoporosis without current pathological fracture: Secondary | ICD-10-CM

## 2020-06-01 ENCOUNTER — Encounter: Payer: Self-pay | Admitting: Family Medicine

## 2020-06-01 NOTE — Progress Notes (Signed)
Subjective:  Patient ID: Jaclyn Robertson, female    DOB: 1935/02/04  Age: 85 y.o. MRN: [id number]  CC: Follow-up   HPI [name] presents for history of dementia for which she has been treated with Aricept.  She could not tolerate it due to a digestive problem.  She does continue to take memantine but her husband who is her caregiver tells me that she is having significant memory issues.  He would like to have something more for her.  However, he declined use of the Exelon patch because of the adverse reactions list he got at the pharmacy.  On the other hand he wants something that will not cause any upset stomach.  Patient is verbal and simply follows his cues with very short answers and looks at him for answers to questions asked directly of her.  She is verbal and alert with orientation to person and place.  Depression screen [facility] 2/9 05/31/2020 02/01/2020 10/29/2019  Decreased Interest 0 0 0  Down, Depressed, Hopeless 0 0 0  PHQ - 2 Score 0 0 0    History Jaclyn Robertson has a past medical history of Dementia ([place]).   She has a past surgical history that includes Cataract extraction w/PHACO (Left, 09/13/2017) and Cataract extraction w/PHACO (Right, 11/15/2017).   Her family history includes Seizures in her mother.She reports that she has never smoked. She has never used smokeless tobacco. She reports current alcohol use of about 2.0 standard drinks of alcohol per week. She reports that she does not use drugs.    ROS Review of Systems  Unable to perform ROS: Dementia    Objective:  BP 109/67   Pulse 60   Temp (!) 97.2 F (36.2 C) (Temporal)   Ht 5' 5.5" (1.664 m)   Wt 124 lb 9.6 oz (56.5 kg)   BMI 20.42 kg/m   BP Readings from Last 3 Encounters:  05/31/20 109/67  05/13/20 138/70  02/01/20 124/82    Wt Readings from Last 3 Encounters:  05/31/20 124 lb 9.6 oz (56.5 kg)  05/13/20 124 lb (56.2 kg)  02/01/20 120 lb (54.4 kg)     Physical Exam Constitutional:      General: She  is not in acute distress.    Appearance: She is well-developed.  HENT:     Head: Normocephalic and atraumatic.  Eyes:     Conjunctiva/sclera: Conjunctivae normal.     Pupils: Pupils are equal, round, and reactive to light.  Neck:     Thyroid: No thyromegaly.  Cardiovascular:     Rate and Rhythm: Normal rate and regular rhythm.     Heart sounds: Normal heart sounds. No murmur heard.   Pulmonary:     Effort: Pulmonary effort is normal. No respiratory distress.     Breath sounds: Normal breath sounds. No wheezing or rales.  Abdominal:     General: Bowel sounds are normal. There is no distension.     Palpations: Abdomen is soft.     Tenderness: There is no abdominal tenderness.  Musculoskeletal:        General: Normal range of motion.     Cervical back: Normal range of motion and neck supple.  Lymphadenopathy:     Cervical: No cervical adenopathy.  Skin:    General: Skin is warm and dry.  Neurological:     Mental Status: She is alert and oriented to person, place, and time.  Psychiatric:        Behavior: Behavior normal.  Thought Content: Thought content normal.        Judgment: Judgment normal.       Assessment & Plan:   Yaffa was seen today for follow-up.  Diagnoses and all orders for this visit:  Late onset Alzheimer's disease without behavioral disturbance ([place]) -     CBC with Differential/Platelet -     CMP14+EGFR -     Lipid panel -     Microalbumin / creatinine urine ratio  Age-related osteoporosis without current pathological fracture -     CBC with Differential/Platelet -     CMP14+EGFR -     Lipid panel -     Microalbumin / creatinine urine ratio  Need for immunization against influenza -     Flu Vaccine QUAD High Dose(Fluad)       I have discontinued Beatriz Chancellor. Ihde's rivastigmine. I am also having her maintain her Vitamin D-3, Vitamin K2, raloxifene, Magnesium Oxide, escitalopram, and memantine.  Allergies as of 05/31/2020      Reactions    Levaquin  [levofloxacin In D5w] Rash      Medication List       Accurate as of May 31, 2020 11:59 PM. If you have any questions, ask your nurse or doctor.        STOP taking these medications   rivastigmine 4.6 mg/24hr Commonly known as: EXELON Stopped by: Claretta Fraise, MD     TAKE these medications   escitalopram 20 MG tablet Commonly known as: Lexapro Take 1 tablet (20 mg total) by mouth daily.   Magnesium Oxide 400 (240 Mg) MG Tabs TAKE 1 TABLET DAILY   memantine 10 MG tablet Commonly known as: NAMENDA Take 1 tablet (10 mg total) by mouth 2 (two) times daily.   raloxifene 60 MG tablet Commonly known as: EVISTA Take 60 mg by mouth once daily   Vitamin D-3 125 MCG (5000 UT) Tabs Take 1 tablet by mouth daily.   Vitamin K2 100 MCG Tabs Take 100 mcg by mouth daily.        Follow-up: Return in about 1 month (around 06/30/2020).  Claretta Fraise, M.D.

## 2020-06-06 DIAGNOSIS — R519 Headache, unspecified: Secondary | ICD-10-CM | POA: Diagnosis not present

## 2020-06-06 DIAGNOSIS — S0101XA Laceration without foreign body of scalp, initial encounter: Secondary | ICD-10-CM | POA: Diagnosis not present

## 2020-06-15 ENCOUNTER — Telehealth: Payer: Self-pay | Admitting: Neurology

## 2020-06-15 NOTE — Telephone Encounter (Signed)
Pt does not need forms for long term care, he needs a letter about her condition for her long term care insurance

## 2020-06-15 NOTE — Telephone Encounter (Signed)
Patient's husband called to ask for Dr Karel Jarvis to fill out the necessary paperwork for patient's long term care. Once completed, he asked to please send to him in the mail [place] [address] [place] [address]

## 2020-06-24 ENCOUNTER — Telehealth: Payer: Self-pay | Admitting: Neurology

## 2020-06-24 NOTE — Telephone Encounter (Signed)
Spoke with Mr Jaclyn Robertson As long as it didn't create more confusion or cause hang over effects, its okay.  Has he tried melatonin? They have not tried melatonin he is going to get 3mg  of melatonin to try instead on using tylenol pm

## 2020-06-24 NOTE — Telephone Encounter (Signed)
As long as it didn't create more confusion or cause hang over effects, its okay.  Has he tried melatonin?

## 2020-06-24 NOTE — Telephone Encounter (Signed)
Patient's spouse called and said, "My wife has been having a lot of trouble sleeping. I've given her 1 Tylenol PM and it seems to help. Is this okay?"  [place]

## 2020-06-28 ENCOUNTER — Telehealth: Payer: Self-pay | Admitting: Neurology

## 2020-06-28 NOTE — Telephone Encounter (Signed)
Patient's daughter called in wanting to speak with someone about her mother's medications. The patient has started to hallucinate, is more anxious, starting to not remember people, and has gotten lost. She is worried. She did find out her brother passed away. They are not sure if that may have triggered something?

## 2020-06-29 ENCOUNTER — Telehealth: Payer: Self-pay | Admitting: Neurology

## 2020-06-29 ENCOUNTER — Other Ambulatory Visit: Payer: Self-pay

## 2020-06-29 DIAGNOSIS — R410 Disorientation, unspecified: Secondary | ICD-10-CM

## 2020-06-29 NOTE — Telephone Encounter (Signed)
See other phone note

## 2020-06-29 NOTE — Telephone Encounter (Signed)
Spoke with pt daughter [name]. When husband called last week, he asked about Tylenol PM for sleep, this can sometimes cause confusion. Would stop this if she is still taking it.Pt daughter text her dad while on the phone with this nurse pt has stopped taken the tylenol PM. The death may be a trigger, also would make sure she is not having any infection going on, check CBC, CMP, urinalysis. Lab orders placed in Epic they are going to try and go to pt PCP Dr Darlyn Read office to have lab work done. Pt is getting lost when going on walks she is not driving

## 2020-06-29 NOTE — Telephone Encounter (Signed)
Where did she get lost, she is not driving, is she? When husband called last week, he asked about Tylenol PM for sleep, this can sometimes cause confusion. Would stop this if she is still taking it. The death may be a trigger, also would make sure she is not having any infection going on, check CBC, CMP, urinalysis. Thanks

## 2020-06-29 NOTE — Telephone Encounter (Signed)
Patient's daughter called in stating she had not heard back from anyone regarding the telephone note from yesterday afternoon. She would like a call back. The daughter's contact info is in the previous telephone note.

## 2020-06-30 ENCOUNTER — Other Ambulatory Visit: Payer: Self-pay

## 2020-06-30 ENCOUNTER — Other Ambulatory Visit: Payer: PPO

## 2020-06-30 DIAGNOSIS — R410 Disorientation, unspecified: Secondary | ICD-10-CM

## 2020-06-30 LAB — COMPREHENSIVE METABOLIC PANEL
ALT: 11 IU/L (ref 0–32)
AST: 16 IU/L (ref 0–40)
Albumin/Globulin Ratio: 1.8 (ref 1.2–2.2)
Albumin: 4.1 g/dL (ref 3.6–4.6)
Alkaline Phosphatase: 80 IU/L (ref 44–121)
BUN/Creatinine Ratio: 17 (ref 12–28)
BUN: 15 mg/dL (ref 8–27)
Bilirubin Total: 0.5 mg/dL (ref 0.0–1.2)
CO2: 25 mmol/L (ref 20–29)
Calcium: 8.9 mg/dL (ref 8.7–10.3)
Chloride: 102 mmol/L (ref 96–106)
Creatinine, Ser: 0.87 mg/dL (ref 0.57–1.00)
GFR calc Af Amer: 72 mL/min/{1.73_m2} (ref >59)
GFR calc non Af Amer: 62 mL/min/{1.73_m2} (ref >59)
Globulin, Total: 2.3 g/dL (ref 1.5–4.5)
Glucose: 80 mg/dL (ref 65–99)
Potassium: 4.6 mmol/L (ref 3.5–5.2)
Sodium: 142 mmol/L (ref 134–144)
Total Protein: 6.4 g/dL (ref 6.0–8.5)

## 2020-06-30 LAB — CBC
Hematocrit: 39.6 % (ref 34.0–46.6)
Hemoglobin: 13.3 g/dL (ref 11.1–15.9)
MCH: 32.1 pg (ref 26.6–33.0)
MCHC: 33.6 g/dL (ref 31.5–35.7)
MCV: 96 fL (ref 79–97)
Platelets: 303 10*3/uL (ref 150–450)
RBC: 4.14 x10E6/uL (ref 3.77–5.28)
RDW: 12.9 % (ref 11.7–15.4)
WBC: 5.2 10*3/uL (ref 3.4–10.8)

## 2020-06-30 NOTE — Addendum Note (Signed)
Addended by: Jaonna Word M on: 06/30/2020 10:10 AM ° ° Modules accepted: Orders ° °

## 2020-06-30 NOTE — Addendum Note (Signed)
Addended by: Margorie John on: 06/30/2020 10:10 AM   Modules accepted: Orders

## 2020-07-01 ENCOUNTER — Telehealth: Payer: Self-pay

## 2020-07-01 LAB — URINALYSIS
Bilirubin, UA: NEGATIVE
Glucose, UA: NEGATIVE
Ketones, UA: NEGATIVE
Leukocytes,UA: NEGATIVE
Nitrite, UA: NEGATIVE
Protein,UA: NEGATIVE
RBC, UA: NEGATIVE
Specific Gravity, UA: 1.022 (ref 1.005–1.030)
Urobilinogen, Ur: 0.2 mg/dL (ref 0.2–1.0)
pH, UA: 7 (ref 5.0–7.5)

## 2020-07-01 MED ORDER — ESCITALOPRAM OXALATE 20 MG PO TABS: ORAL_TABLET | ORAL | 1 refills | Status: DC

## 2020-07-01 NOTE — Telephone Encounter (Signed)
-----   Message from Van Clines, MD sent at 07/01/2020 12:42 PM EST ----- Pls let daughter know that bloodwork and urinalysis are normal. Symptoms are likely progression of dementia in addition to depression from recent events. We can try increasing the Lexapro 20mg : Take 1.5 tablets daily. If she is agreeable, pls send in updated Rx, Thanks

## 2020-07-01 NOTE — Telephone Encounter (Signed)
Spoke with pt husband informed him bloodwork and urinalysis are normal. Symptoms are likely progression of dementia in addition to depression from recent events. We can try increasing the Lexapro 20mg : Take 1.5 tablets daily. Explained to pt husband that she will be taken 30 mg of Lexapro total, when I was reading the instruction to him at fist I took a breath between the 20mg  and take 1.5 tab daily and pt husband was confused stated pt is aready on 20 mg of lexapro  so I explained to him that Dr was adding that 1/2 tablet to the 20mg  tablet to make it 30mg  pt husband verbalized understanding and  new script sent to the pharmacy for the pt.

## 2020-07-04 ENCOUNTER — Telehealth: Payer: Self-pay | Admitting: Neurology

## 2020-07-04 ENCOUNTER — Other Ambulatory Visit: Payer: Self-pay | Admitting: Neurology

## 2020-07-04 MED ORDER — TRAZODONE HCL 50 MG PO TABS: 50.0000 mg | ORAL_TABLET | Freq: Every day | ORAL | 11 refills | Status: DC

## 2020-07-04 NOTE — Telephone Encounter (Signed)
Patient daughter has some questions about the patient's medication she was not sure of the name of the medication and will have the information when she gets a call back from our office

## 2020-07-04 NOTE — Telephone Encounter (Signed)
Patient's daughter [name] called in and left a message returning Christy's call

## 2020-07-04 NOTE — Telephone Encounter (Signed)
No answer at 250

## 2020-07-04 NOTE — Telephone Encounter (Signed)
No answer at 321 returning call 07/04/2020.

## 2020-07-04 NOTE — Telephone Encounter (Signed)
Daughter called her mother has been hallucinating and not sleeping and has been wandering in there house, even asked her husband who are you and standing over him in the middle of the night. They are requesting a medication for sleep so the husband and she can sleep. They are looking at long term care, but as of right now, they need help with this. Please advise, I did tell daugher it may be tomorrow before I could get back with her, The lexapro was just adjusted and she is wondering if that is a side effect as well. And how long it takes .

## 2020-07-04 NOTE — Telephone Encounter (Signed)
Can try Trazodone 50mg  at bedtime. I sent in Rx, thanks

## 2020-07-05 NOTE — Telephone Encounter (Signed)
Left message for [name] to call me.

## 2020-07-05 NOTE — Telephone Encounter (Signed)
Spoke with daugher [name].

## 2020-07-25 ENCOUNTER — Telehealth: Payer: Self-pay | Admitting: Neurology

## 2020-07-25 MED ORDER — DIVALPROEX SODIUM 125 MG PO CSDR: DELAYED_RELEASE_CAPSULE | ORAL | 5 refills | Status: DC

## 2020-07-25 NOTE — Telephone Encounter (Signed)
Patient's husband called in wanting to find out if a change needed to be made to the patient's medication. The patient has been having hallucinations and they are getting worse.
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
## 2020-07-27 ENCOUNTER — Telehealth: Payer: Self-pay | Admitting: Neurology

## 2020-07-27 DIAGNOSIS — R4182 Altered mental status, unspecified: Secondary | ICD-10-CM

## 2020-07-27 NOTE — Telephone Encounter (Signed)
Ok to send order for urinalysis to their office of request, thank you

## 2020-07-27 NOTE — Telephone Encounter (Signed)
Can you pls ask if she can get her local doctor to order the UA? Because they will be treating it anyway if it was abnormal. If not, we can order it but abnormal results will be sent to her PCP for treatment. Thanks

## 2020-07-27 NOTE — Telephone Encounter (Signed)
[facility] called and requested the U/A order be switched to clinic collect. Patient is dropping by a sample to her PCP and that doctor is out of the office today.

## 2020-07-27 NOTE — Telephone Encounter (Signed)
Order placed and patients daughter [name] has been notified.

## 2020-07-27 NOTE — Addendum Note (Signed)
Addended by: Kandice Robinsons T on: 07/27/2020 12:56 PM   Modules accepted: Orders

## 2020-07-27 NOTE — Telephone Encounter (Signed)
Patient's daughter called in wanting an Urinalysis ordered for the patient. She wants to go to her local doctor in [place] to get it done.

## 2020-07-27 NOTE — Telephone Encounter (Signed)
Spoke with daughter [name]. She requested we place the order because the PCP is requiring a visit and she prefers to have to take her mother to the doctor only if it is a positive test.  She requested a phone call letting her know once the order is in place.

## 2020-07-28 ENCOUNTER — Other Ambulatory Visit: Payer: Self-pay

## 2020-07-28 ENCOUNTER — Encounter: Payer: Self-pay | Admitting: Family Medicine

## 2020-07-28 ENCOUNTER — Ambulatory Visit (INDEPENDENT_AMBULATORY_CARE_PROVIDER_SITE_OTHER): Payer: PPO | Admitting: Family Medicine

## 2020-07-28 DIAGNOSIS — R350 Frequency of micturition: Secondary | ICD-10-CM

## 2020-07-28 DIAGNOSIS — R300 Dysuria: Secondary | ICD-10-CM

## 2020-07-28 LAB — URINALYSIS, COMPLETE
Bilirubin, UA: NEGATIVE
Glucose, UA: NEGATIVE
Ketones, UA: NEGATIVE
Leukocytes,UA: NEGATIVE
Nitrite, UA: NEGATIVE
Protein,UA: NEGATIVE
Specific Gravity, UA: 1.015 (ref 1.005–1.030)
Urobilinogen, Ur: 0.2 mg/dL (ref 0.2–1.0)
pH, UA: 6 (ref 5.0–7.5)

## 2020-07-28 LAB — MICROSCOPIC EXAMINATION
Bacteria, UA: NONE SEEN
Epithelial Cells (non renal): NONE SEEN /hpf (ref 0–10)
RBC, Urine: NONE SEEN /hpf (ref 0–2)
WBC, UA: NONE SEEN /hpf (ref 0–5)

## 2020-07-28 NOTE — Telephone Encounter (Signed)
Spoke with Woman at [facility] to verify the correct order.   Order replaced.

## 2020-07-28 NOTE — Progress Notes (Signed)
Subjective:    Patient ID: Jaclyn Robertson, female    DOB: 02-01-1935, 86 y.o.   MRN: [id number]   HPI: Jaclyn Robertson is a 86 y.o. female presenting for caregiver says pt. Urinating more frequently. Denies pain. No fever. No dysuria or incontinence. Pt. Has dementia. History given by Gardiner Ramus. He brought in a specimen of her urine for evaluation earlier today. That result is reviewed & attached  Depression screen [facility] 2/9 05/31/2020 02/01/2020 10/29/2019 09/29/2019 07/23/2019  Decreased Interest 0 0 0 0 0  Down, Depressed, Hopeless 0 0 0 0 0  PHQ - 2 Score 0 0 0 0 0     Relevant past medical, surgical, family and social history reviewed and updated as indicated.  Interim medical history since our last visit reviewed. Allergies and medications reviewed and updated.  ROS:  Review of Systems  Unable to perform ROS: Dementia     Social History   Tobacco Use  Smoking Status Never Smoker  Smokeless Tobacco Never Used       Objective:     Wt Readings from Last 3 Encounters:  05/31/20 124 lb 9.6 oz (56.5 kg)  05/13/20 124 lb (56.2 kg)  02/01/20 120 lb (54.4 kg)     Exam deferred. Pt. Harboring due to COVID 19. Phone visit performed.  Results for orders placed or performed in visit on 07/28/20  Microscopic Examination   Urine  Result Value Ref Range   WBC, UA None seen 0 - 5 /hpf   RBC None seen 0 - 2 /hpf   Epithelial Cells (non renal) None seen 0 - 10 /hpf   Bacteria, UA None seen None seen/Few  Urinalysis, Complete  Result Value Ref Range   Specific Gravity, UA 1.015 1.005 - 1.030   pH, UA 6.0 5.0 - 7.5   Color, UA Yellow Yellow   Appearance Ur Clear Clear   Leukocytes,UA Negative Negative   Protein,UA Negative Negative/Trace   Glucose, UA Negative Negative   Ketones, UA Negative Negative   RBC, UA Trace (A) Negative   Bilirubin, UA Negative Negative   Urobilinogen, Ur 0.2 0.2 - 1.0 mg/dL   Nitrite, UA Negative Negative   Microscopic Examination See  below:     Assessment & Plan:   1. Urinary frequency      A culture was ordered as a precaution.  The patient's husband will monitor her at home along with her caregiver in the meantime.  Should the culture show signs of infection will go ahead with antibiotics at that time.  Symptoms are insufficient at this time to warrant prophylactic treatment.    Virtual Visit via telephone Note  I discussed the limitations, risks, security and privacy concerns of performing an evaluation and management service by telephone and the availability of in person appointments. The patient was identified with two identifiers. Pt.expressed understanding and agreed to proceed. Pt. Is at home. Dr. Darlyn Read is in his office.  Follow Up Instructions:   I discussed the assessment and treatment plan with the patient. The patient was provided an opportunity to ask questions and all were answered. The patient agreed with the plan and demonstrated an understanding of the instructions.   The patient was advised to call back or seek an in-person evaluation if the symptoms worsen or if the condition fails to improve as anticipated.   Total minutes including chart review and phone contact time: 12   Follow up plan: Return if symptoms worsen or fail  to improve.  Claretta Fraise, MD [place]

## 2020-07-29 LAB — URINE CULTURE

## 2020-08-02 ENCOUNTER — Telehealth: Payer: Self-pay | Admitting: Neurology

## 2020-08-02 NOTE — Telephone Encounter (Signed)
Patient's daughter called in stating the patient's "sun downing" has been horrible over the last week. She is agitated, won't sleep, won't get in the same room as certain family members. All of this occurs after 4pm, she is doing ok during the day. They are trying to move her into memory care as soon as they can. They just need some kind of medication to help with her anxiety and restlessness.

## 2020-08-02 NOTE — Telephone Encounter (Signed)
Pls have her increase the Depakote 125mg :take 2 tablets at 4pm. Also increase the Trazodone 50mg : Take 2 tabs every night. Thanks

## 2020-08-02 NOTE — Telephone Encounter (Signed)
Called patients daughter and informed her of the changes instructed by Dr. Karel Jarvis of increasing Depakote 125 mg to two tablets at 4 pm and Trazodone 50 mg two tabs nightly. Patients daughter verbalized understanding.   Patients daughter wanted to also let Dr. Karel Jarvis know as a FYI that in a few weeks or so patient will be transitioning into a memory care facility. She stated that her mother may need some help with her anxiety to help ease the transition. She also stated that she may have paperwork for Dr. Karel Jarvis for diagnosis and such. Patient daughter informed me that once the time gets nearer and she knows exactly more of what she will need for patient and the transition, she will give our office a call back.

## 2020-08-04 ENCOUNTER — Telehealth: Payer: Self-pay | Admitting: Neurology

## 2020-08-04 NOTE — Telephone Encounter (Signed)
Done

## 2020-08-04 NOTE — Telephone Encounter (Signed)
[name], patient's daughter, called to check on the status of the FL2 form that was sent in by fax yesterday.   I confirmed we did receive the form and it was placed in Dr. Rosalyn Gess box for completion. Patient is transitioning to a long-term care facility and they are hoping the form can be completed by 08/05/20, if possible.

## 2020-08-11 ENCOUNTER — Telehealth: Payer: Self-pay | Admitting: Neurology

## 2020-08-11 NOTE — Telephone Encounter (Signed)
Patient's daughter called in stating they are going to be moving forward with the patient going into memory care. She would like to see about getting the patient on some kind of anxiety medication that will help her with that transition. She doesn't want her to "out of it", but feels something would be needed.

## 2020-08-11 NOTE — Telephone Encounter (Signed)
The Lexapro can help with anxiety, we can increase to 20mg  daily. If she is asking about medications such as Xanax or Valium for anxiety, we typically avoid these types of medication in patients with dementia as it could make them more confused. Thanks

## 2020-08-11 NOTE — Telephone Encounter (Signed)
Spoke with pt daughter [name] and informed her that Lexapro can help with anxiety, we can increase to 20mg  daily. Also Per Dr.  If she is asking about medications such as Xanax or Valium for anxiety, we typically avoid these types of medication in patients with dementia as it could make them more confused. Pt daughter wasn't expecting that answer and stated that she would call the facility and get their recommendation and then call back to set up an appointment with Dr Karel Jarvis. Pt daughter stated that this is not a normal situation moving into a memory care and she wants her mom to have something.

## 2020-08-15 ENCOUNTER — Other Ambulatory Visit: Payer: Self-pay

## 2020-08-15 ENCOUNTER — Telehealth: Payer: Self-pay | Admitting: Neurology

## 2020-08-15 MED ORDER — TRAZODONE HCL 100 MG PO TABS: 100.0000 mg | ORAL_TABLET | Freq: Every day | ORAL | 2 refills | Status: DC

## 2020-08-15 NOTE — Telephone Encounter (Signed)
Pharmacy called to ask for an updated prescription for Trazadone for patient. States patient came in 2 weeks early to refill medication because they were told to increase dosage to 2 pills or 100mg  at bedtime. Please send updated prescription to pharmacy.

## 2020-08-15 NOTE — Telephone Encounter (Signed)
Updated script sent to pharmacy on file

## 2020-08-19 ENCOUNTER — Telehealth: Payer: Self-pay

## 2020-08-19 NOTE — Telephone Encounter (Signed)
FL2 form refaxed to [facility] at [phone] along with other requested paperwork,

## 2020-08-26 ENCOUNTER — Telehealth: Payer: Self-pay

## 2020-08-26 NOTE — Telephone Encounter (Signed)
FL2 and requested paperwork faxed to 3rd fax number given to me at [phone]

## 2020-08-30 ENCOUNTER — Telehealth: Payer: Self-pay | Admitting: Neurology

## 2020-08-30 NOTE — Telephone Encounter (Signed)
Patient's daughter wants to have her friend Kym Groom come by and pick up the information that will not go through fax and they will physically take it over since the faxes are not going through. She will come pick it up tomorrow.

## 2020-08-31 ENCOUNTER — Ambulatory Visit: Payer: PPO | Admitting: Family Medicine

## 2020-08-31 NOTE — Telephone Encounter (Signed)
Paperwork at the front for Pt daughters friend Clydie Braun to come by and pick up,

## 2020-09-09 ENCOUNTER — Observation Stay (HOSPITAL_COMMUNITY): Payer: PPO

## 2020-09-09 ENCOUNTER — Emergency Department (HOSPITAL_COMMUNITY): Payer: PPO

## 2020-09-09 ENCOUNTER — Other Ambulatory Visit: Payer: Self-pay

## 2020-09-09 ENCOUNTER — Other Ambulatory Visit (HOSPITAL_COMMUNITY): Payer: PPO

## 2020-09-09 ENCOUNTER — Emergency Department (HOSPITAL_BASED_OUTPATIENT_CLINIC_OR_DEPARTMENT_OTHER): Payer: PPO

## 2020-09-09 ENCOUNTER — Encounter (HOSPITAL_COMMUNITY): Payer: Self-pay | Admitting: Internal Medicine

## 2020-09-09 ENCOUNTER — Observation Stay (HOSPITAL_COMMUNITY)
Admission: EM | Admit: 2020-09-09 | Discharge: 2020-09-10 | Disposition: A | Discharge status: Home / Self Care | Payer: PPO | Attending: Student | Admitting: Student

## 2020-09-09 DIAGNOSIS — I499 Cardiac arrhythmia, unspecified: Secondary | ICD-10-CM | POA: Diagnosis not present

## 2020-09-09 DIAGNOSIS — F0280 Dementia in other diseases classified elsewhere without behavioral disturbance: Secondary | ICD-10-CM | POA: Diagnosis present

## 2020-09-09 DIAGNOSIS — Z79899 Other long term (current) drug therapy: Secondary | ICD-10-CM | POA: Diagnosis not present

## 2020-09-09 DIAGNOSIS — G319 Degenerative disease of nervous system, unspecified: Secondary | ICD-10-CM | POA: Diagnosis not present

## 2020-09-09 DIAGNOSIS — I361 Nonrheumatic tricuspid (valve) insufficiency: Secondary | ICD-10-CM | POA: Diagnosis not present

## 2020-09-09 DIAGNOSIS — R4182 Altered mental status, unspecified: Secondary | ICD-10-CM | POA: Diagnosis not present

## 2020-09-09 DIAGNOSIS — R2681 Unsteadiness on feet: Secondary | ICD-10-CM | POA: Diagnosis not present

## 2020-09-09 DIAGNOSIS — R52 Pain, unspecified: Secondary | ICD-10-CM | POA: Diagnosis not present

## 2020-09-09 DIAGNOSIS — R55 Syncope and collapse: Principal | ICD-10-CM | POA: Insufficient documentation

## 2020-09-09 DIAGNOSIS — R0902 Hypoxemia: Secondary | ICD-10-CM | POA: Diagnosis not present

## 2020-09-09 DIAGNOSIS — G9340 Encephalopathy, unspecified: Secondary | ICD-10-CM | POA: Insufficient documentation

## 2020-09-09 DIAGNOSIS — I517 Cardiomegaly: Secondary | ICD-10-CM | POA: Diagnosis not present

## 2020-09-09 DIAGNOSIS — R29818 Other symptoms and signs involving the nervous system: Secondary | ICD-10-CM | POA: Diagnosis not present

## 2020-09-09 DIAGNOSIS — I351 Nonrheumatic aortic (valve) insufficiency: Secondary | ICD-10-CM | POA: Diagnosis not present

## 2020-09-09 DIAGNOSIS — Z20822 Contact with and (suspected) exposure to covid-19: Secondary | ICD-10-CM | POA: Diagnosis not present

## 2020-09-09 DIAGNOSIS — G301 Alzheimer's disease with late onset: Secondary | ICD-10-CM | POA: Insufficient documentation

## 2020-09-09 DIAGNOSIS — I672 Cerebral atherosclerosis: Secondary | ICD-10-CM | POA: Diagnosis not present

## 2020-09-09 DIAGNOSIS — I6782 Cerebral ischemia: Secondary | ICD-10-CM | POA: Diagnosis not present

## 2020-09-09 DIAGNOSIS — I959 Hypotension, unspecified: Secondary | ICD-10-CM | POA: Diagnosis not present

## 2020-09-09 DIAGNOSIS — I340 Nonrheumatic mitral (valve) insufficiency: Secondary | ICD-10-CM | POA: Diagnosis not present

## 2020-09-09 LAB — AMMONIA: Ammonia: 20 umol/L (ref 9–35)

## 2020-09-09 LAB — COMPREHENSIVE METABOLIC PANEL
ALT: 16 U/L (ref 0–44)
AST: 23 U/L (ref 15–41)
Albumin: 3 g/dL — ABNORMAL LOW (ref 3.5–5.0)
Alkaline Phosphatase: 80 U/L (ref 38–126)
Anion gap: 8 (ref 5–15)
BUN: 13 mg/dL (ref 8–23)
CO2: 26 mmol/L (ref 22–32)
Calcium: 7.7 mg/dL — ABNORMAL LOW (ref 8.9–10.3)
Chloride: 109 mmol/L (ref 98–111)
Creatinine, Ser: 1.07 mg/dL — ABNORMAL HIGH (ref 0.44–1.00)
GFR, Estimated: 52 mL/min — ABNORMAL LOW (ref >60)
Glucose, Bld: 97 mg/dL (ref 70–99)
Potassium: 4.2 mmol/L (ref 3.5–5.1)
Sodium: 143 mmol/L (ref 135–145)
Total Bilirubin: 0.6 mg/dL (ref 0.3–1.2)
Total Protein: 5.4 g/dL — ABNORMAL LOW (ref 6.5–8.1)

## 2020-09-09 LAB — PROTIME-INR
INR: 1.1 (ref 0.8–1.2)
Prothrombin Time: 13.8 seconds (ref 11.4–15.2)

## 2020-09-09 LAB — CBC WITH DIFFERENTIAL/PLATELET
Abs Immature Granulocytes: 0.04 10*3/uL (ref 0.00–0.07)
Basophils Absolute: 0 10*3/uL (ref 0.0–0.1)
Basophils Relative: 0 %
Eosinophils Absolute: 0 10*3/uL (ref 0.0–0.5)
Eosinophils Relative: 0 %
HCT: 34.2 % — ABNORMAL LOW (ref 36.0–46.0)
Hemoglobin: 11.3 g/dL — ABNORMAL LOW (ref 12.0–15.0)
Immature Granulocytes: 0 %
Lymphocytes Relative: 21 %
Lymphs Abs: 2 10*3/uL (ref 0.7–4.0)
MCH: 32.6 pg (ref 26.0–34.0)
MCHC: 33 g/dL (ref 30.0–36.0)
MCV: 98.6 fL (ref 80.0–100.0)
Monocytes Absolute: 0.8 10*3/uL (ref 0.1–1.0)
Monocytes Relative: 9 %
Neutro Abs: 6.4 10*3/uL (ref 1.7–7.7)
Neutrophils Relative %: 70 %
Platelets: 230 10*3/uL (ref 150–400)
RBC: 3.47 MIL/uL — ABNORMAL LOW (ref 3.87–5.11)
RDW: 13.2 % (ref 11.5–15.5)
WBC: 9.2 10*3/uL (ref 4.0–10.5)
nRBC: 0 % (ref 0.0–0.2)

## 2020-09-09 LAB — URINALYSIS, ROUTINE W REFLEX MICROSCOPIC
Bilirubin Urine: NEGATIVE
Glucose, UA: NEGATIVE mg/dL
Hgb urine dipstick: NEGATIVE
Ketones, ur: NEGATIVE mg/dL
Leukocytes,Ua: NEGATIVE
Nitrite: NEGATIVE
Protein, ur: NEGATIVE mg/dL
Specific Gravity, Urine: 1.009 (ref 1.005–1.030)
pH: 7 (ref 5.0–8.0)

## 2020-09-09 LAB — BASIC METABOLIC PANEL
Anion gap: 9 (ref 5–15)
BUN: 16 mg/dL (ref 8–23)
CO2: 27 mmol/L (ref 22–32)
Calcium: 8.1 mg/dL — ABNORMAL LOW (ref 8.9–10.3)
Chloride: 108 mmol/L (ref 98–111)
Creatinine, Ser: 1.16 mg/dL — ABNORMAL HIGH (ref 0.44–1.00)
GFR, Estimated: 47 mL/min — ABNORMAL LOW (ref >60)
Glucose, Bld: 92 mg/dL (ref 70–99)
Potassium: 3.9 mmol/L (ref 3.5–5.1)
Sodium: 144 mmol/L (ref 135–145)

## 2020-09-09 LAB — VALPROIC ACID LEVEL: Valproic Acid Lvl: 18 ug/mL — ABNORMAL LOW (ref 50.0–100.0)

## 2020-09-09 LAB — APTT: aPTT: 26 seconds (ref 24–36)

## 2020-09-09 LAB — I-STAT CHEM 8, ED
BUN: 16 mg/dL (ref 8–23)
Calcium, Ion: 0.97 mmol/L — ABNORMAL LOW (ref 1.15–1.40)
Chloride: 111 mmol/L (ref 98–111)
Creatinine, Ser: 0.9 mg/dL (ref 0.44–1.00)
Glucose, Bld: 92 mg/dL (ref 70–99)
HCT: 37 % (ref 36.0–46.0)
Hemoglobin: 12.6 g/dL (ref 12.0–15.0)
Potassium: 4.3 mmol/L (ref 3.5–5.1)
Sodium: 143 mmol/L (ref 135–145)
TCO2: 24 mmol/L (ref 22–32)

## 2020-09-09 LAB — TROPONIN I (HIGH SENSITIVITY)
Troponin I (High Sensitivity): 7 ng/L (ref <18)
Troponin I (High Sensitivity): 7 ng/L (ref <18)

## 2020-09-09 LAB — ECHOCARDIOGRAM COMPLETE
Area-P 1/2: 2.29 cm2
P 1/2 time: 812 msec
S' Lateral: 3 cm

## 2020-09-09 LAB — RAPID URINE DRUG SCREEN, HOSP PERFORMED
Amphetamines: NOT DETECTED
Barbiturates: NOT DETECTED
Benzodiazepines: POSITIVE — AB
Cocaine: NOT DETECTED
Opiates: NOT DETECTED
Tetrahydrocannabinol: NOT DETECTED

## 2020-09-09 LAB — RESP PANEL BY RT-PCR (FLU A&B, COVID) ARPGX2
Influenza A by PCR: NEGATIVE
Influenza B by PCR: NEGATIVE
SARS Coronavirus 2 by RT PCR: NEGATIVE

## 2020-09-09 LAB — HEMOGLOBIN A1C
Hgb A1c MFr Bld: 5.4 % (ref 4.8–5.6)
Mean Plasma Glucose: 108.28 mg/dL

## 2020-09-09 LAB — TSH: TSH: 1.636 u[IU]/mL (ref 0.350–4.500)

## 2020-09-09 MED ORDER — TRAZODONE HCL 100 MG PO TABS
100.0000 mg | ORAL_TABLET | Freq: Every day | ORAL | Status: DC
  Administered 2020-09-09: 100 mg via ORAL
  Filled 2020-09-09: qty 1

## 2020-09-09 MED ORDER — LACTATED RINGERS IV SOLN: INTRAVENOUS | Status: DC

## 2020-09-09 MED ORDER — ONDANSETRON HCL 4 MG/2ML IJ SOLN: 4.0000 mg | Freq: Four times a day (QID) | INTRAMUSCULAR | Status: DC | PRN

## 2020-09-09 MED ORDER — ASPIRIN EC 81 MG PO TBEC
81.0000 mg | DELAYED_RELEASE_TABLET | Freq: Every day | ORAL | Status: DC
  Administered 2020-09-10: 81 mg via ORAL
  Filled 2020-09-09: qty 1

## 2020-09-09 MED ORDER — ENOXAPARIN SODIUM 40 MG/0.4ML ~~LOC~~ SOLN
40.0000 mg | SUBCUTANEOUS | Status: DC
  Administered 2020-09-09: 40 mg via SUBCUTANEOUS
  Filled 2020-09-09: qty 0.4

## 2020-09-09 MED ORDER — ESCITALOPRAM OXALATE 20 MG PO TABS
30.0000 mg | ORAL_TABLET | Freq: Every day | ORAL | Status: DC
  Administered 2020-09-10: 30 mg via ORAL

## 2020-09-09 MED ORDER — IOHEXOL 350 MG/ML SOLN
50.0000 mL | Freq: Once | INTRAVENOUS | Status: AC | PRN
  Administered 2020-09-09: 50 mL via INTRAVENOUS

## 2020-09-09 MED ORDER — ACETAMINOPHEN 650 MG RE SUPP: 650.0000 mg | Freq: Four times a day (QID) | RECTAL | Status: DC | PRN

## 2020-09-09 MED ORDER — DIVALPROEX SODIUM 125 MG PO DR TAB: 125.0000 mg | DELAYED_RELEASE_TABLET | Freq: Every day | ORAL | Status: DC

## 2020-09-09 MED ORDER — MEMANTINE HCL 10 MG PO TABS
10.0000 mg | ORAL_TABLET | Freq: Two times a day (BID) | ORAL | Status: DC
  Administered 2020-09-09 – 2020-09-10 (×2): 10 mg via ORAL
  Filled 2020-09-09 (×2): qty 1

## 2020-09-09 MED ORDER — SODIUM CHLORIDE 0.9 % IV BOLUS
1000.0000 mL | Freq: Once | INTRAVENOUS | Status: AC
  Administered 2020-09-09: 1000 mL via INTRAVENOUS

## 2020-09-09 MED ORDER — SODIUM CHLORIDE 0.9% FLUSH
3.0000 mL | Freq: Two times a day (BID) | INTRAVENOUS | Status: DC
  Administered 2020-09-09 – 2020-09-10 (×3): 3 mL via INTRAVENOUS

## 2020-09-09 MED ORDER — ACETAMINOPHEN 325 MG PO TABS: 650.0000 mg | ORAL_TABLET | Freq: Four times a day (QID) | ORAL | Status: DC | PRN

## 2020-09-09 MED ORDER — DIVALPROEX SODIUM 125 MG PO DR TAB
125.0000 mg | DELAYED_RELEASE_TABLET | Freq: Every day | ORAL | Status: DC
Start: 2020-09-09 — End: 2020-09-10
  Administered 2020-09-09 – 2020-09-10 (×2): 125 mg via ORAL
  Filled 2020-09-09 (×3): qty 1

## 2020-09-09 MED ORDER — ONDANSETRON HCL 4 MG PO TABS: 4.0000 mg | ORAL_TABLET | Freq: Four times a day (QID) | ORAL | Status: DC | PRN

## 2020-09-09 NOTE — ED Triage Notes (Signed)
Patient BIB [facility] for witnessed syncopal episode. Per EMS, facility reports patient did not sleep last night, was given a xanax this morning and then had a syncopal episode while walking ten minutes later. Patient slumped against a wall and was placed in a wheelchair, did not fall or hit head. BP with EMS initially was 70 systolic, increased to 100 systolic after 700 mL NS. Patient is somnolent, responds to voice.

## 2020-09-09 NOTE — Consult Note (Signed)
Neurology Consultation  CC: Syncope versus near syncope, Code Stroke  History is obtained from: Chart review, daughter at bedside, EDP  HPI: Jaclyn Robertson is a 86 y.o. female with a medical history significant for Alzheimer's dementia who presented to the ED via EMS after a staff member at her facility witnessed her walking with a "glazed look" in her eyes, leaned rightward into a wall while walking, and slid to the floor at 08:30 this morning (baseline ambulates independently without difficulty). On EMS arrival, they found her systolic blood pressure to be 70, improved to 110 on arrival to the ED. Per Jaclyn Robertson's daughter, Jaclyn Robertson was moved into a memory care unit at her facility yesterday for rapid memory decline and behavioral disturbances and her Depakote dose was increased from 125 to 250 mg daily. Her daughter reports gait disturbances with stumbling gait for 1-2 weeks noting last night at 19:30 seeing her mother with new onset bilateral hand tremor with walking and Ms. Ellerbrock asking "Am I drunk?" while she was ambulating.   LKW: 09/08/20 19:30 tpa given?: No, outside of tPA window IR Thrombectomy? No, no evidence of LVO on presentation or initial imaging Modified Rankin Scale: 3-Moderate disability-requires help but walks WITHOUT assistance walks independently without assistive device but is in memory care unit at skilled nursing facility  NIHSS:  1a Level of Conscious.: 2; stuporous requires constant, repetitive stimuli with minimal participation 1b LOC Questions: 2; baseline Alzheimer's dementia, unable to recall age or month at baseline 1c LOC Commands: 1; inconsistently opens eyes to command, sometimes opens mouth when asked to open eyes 2 Best Gaze: 0 3 Visual: 0 4 Facial Palsy: 0 5a Motor Arm - Left: 1, possibly related to poor attention 5b Motor Arm - Right: 0 6a Motor Leg - Left: 1; possibly related to poor attention 6b Motor Leg - Right: 0 7 Limb Ataxia: 0 8 Sensory: 0 9 Best  Language: 2 10 Dysarthria: 2 11 Extinct. and Inatten.: 0 TOTAL: 11  ROS:  Unable to obtain due to altered mental status.   Past Medical History:  Diagnosis Date  . Dementia (HCC)    Family History  Problem Relation Age of Onset  . Seizures Mother    Social History:  reports that she has never smoked. She has never used smokeless tobacco. She reports current alcohol use of about 2.0 standard drinks of alcohol per week. She reports that she does not use drugs.  Current Medications: Medication Sig Start Date End Date Taking? Authorizing Provider  Cholecalciferol (VITAMIN D-3) 125 MCG (5000 UT) TABS Take 1 tablet by mouth daily. 10/06/19   Hilts, Casimiro Needle, MD  divalproex (DEPAKOTE SPRINKLE) 125 MG capsule Give 2 capsules every evening 07/25/20   Van Clines, MD  escitalopram [facility]) 20 MG tablet Take 1.5 tablets daily 07/01/20   Van Clines, MD  Magnesium Oxide 400 (240 Mg) MG TABS TAKE 1 TABLET DAILY 05/12/20   Hilts, Casimiro Needle, MD  memantine (NAMENDA) 10 MG tablet Take 1 tablet (10 mg total) by mouth 2 (two) times daily. 05/13/20   Van Clines, MD  Menatetrenone (VITAMIN K2) 100 MCG TABS Take 100 mcg by mouth daily. 10/06/19   Hilts, Casimiro Needle, MD  raloxifene (EVISTA) 60 MG tablet Take 60 mg by mouth once daily 11/02/19   Mechele Claude, MD  traZODone (DESYREL) 100 MG tablet Take 1 tablet (100 mg total) by mouth at bedtime. 08/15/20   Van Clines, MD   Exam: Current vital signs: BP (!) 145/80  Comment: RA  Pulse 61   Temp 97.7 F (36.5 C) (Oral)   Resp 18 Comment: RA  SpO2 97%    Physical Exam  Constitutional: Appears well-developed, in no acute distress.  Psych: Unable to assess due to stuporous state Eyes: Normal conjunctiva EENT: No OP obstruction Head: Normocephalic and atraumatic, without obvious deformity  Cardiovascular: Normal rate on cardiac monitor, extremities without edema Respiratory: Effort normal, non-labored breathing on room air GI:  Soft.  No distension.  Skin: WDI  Neuro: Mental Status: Jaclyn Robertson is stuporous with brief eye opening to various degrees of stimulation throughout the examination. Inconsistently opens eyes to voice or tactile stimulation. States "ouch" or brief, 1-2 second eye opening with noxious stimuli. She is unable to give a clear or coherent history. States her first name when asked but does not participate with further orientation questions. Poor attention is noted. She does not name objects for examiner. She does not have any obvious signs of neglect. Intermittently and inconsistently opens eyes to command.  Cranial Nerves: II: Visual Fields are full. Resists active eye opening with eyelid opening apraxia noted with active opening. Pupils are equal, round, and reactive to light 2 mm/ brisk III,IV, VI: Occasionally will track stimuli in horizontal visual fields; inconsistently will attend to vertical visual fields. Does not track examiner consistently.  V: Unable to assess due to Jaclyn Robertson mental status VII: Face appears symmetric resting and grimacing VIII: Hearing is intact to voice, occasionally opens eyes briefly to verbal stimuli X: Phonation intact.  XI: Jaclyn Robertson unable to follow commands for shoulder shrug XII: Unable to assess due to Jaclyn Robertson not following commands, does not protrude tongue Motor: Tone is increased in bilateral upper extremities, normal in lower extremities. Bulk is normal.  Strength assessment inconsistent. Initially lower extremities 3/5 with Jaclyn Robertson active antigravity movement but immediate drift to bed due to poor attention, second attempt, she is able to overcome gravity without drift of RLE. She withdrawals bilateral lower extremities briskly with application of noxious stimuli. Bilateral upper extremities 4/5 with withdrawal to noxious stimuli and ability to overcome gravity. Right upper extremity with inconsistent drift on exam, no drift noted on second attempt.   Sensory: Withdrawals bilateral upper and lower extremities equally with noxious stimuli application.  Deep Tendon Reflexes: 2+ and symmetric in the biceps and patellae. Plantars: Toes are up-going on left, downgoing on the right.  Cerebellar: Unable to assess due to Jaclyn Robertson not able to follow commands   I have reviewed labs in epic and the pertinent results are: CBC    Component Value Date/Time   WBC 9.2 09/09/2020 1000   RBC 3.47 (L) 09/09/2020 1000   HGB 12.6 09/09/2020 1129   HGB 13.3 06/30/2020 1024   HCT 37.0 09/09/2020 1129   HCT 39.6 06/30/2020 1024   PLT 230 09/09/2020 1000   PLT 303 06/30/2020 1024   MCV 98.6 09/09/2020 1000   MCV 96 06/30/2020 1024   MCH 32.6 09/09/2020 1000   MCHC 33.0 09/09/2020 1000   RDW 13.2 09/09/2020 1000   RDW 12.9 06/30/2020 1024   LYMPHSABS 2.0 09/09/2020 1000   MONOABS 0.8 09/09/2020 1000   EOSABS 0.0 09/09/2020 1000   BASOSABS 0.0 09/09/2020 1000   CMP     Component Value Date/Time   NA 143 09/09/2020 1129   NA 142 06/30/2020 1024   K 4.3 09/09/2020 1129   CL 111 09/09/2020 1129   CO2 26 09/09/2020 1030   GLUCOSE 92 09/09/2020 1129   BUN 16  09/09/2020 1129   BUN 15 06/30/2020 1024   CREATININE 0.90 09/09/2020 1129   CALCIUM 7.7 (L) 09/09/2020 1030   PROT 5.4 (L) 09/09/2020 1030   PROT 6.4 06/30/2020 1024   ALBUMIN 3.0 (L) 09/09/2020 1030   ALBUMIN 4.1 06/30/2020 1024   AST 23 09/09/2020 1030   ALT 16 09/09/2020 1030   ALKPHOS 80 09/09/2020 1030   BILITOT 0.6 09/09/2020 1030   BILITOT 0.5 06/30/2020 1024   GFRNONAA 52 (L) 09/09/2020 1030   GFRAA 72 06/30/2020 1024   Lab Results  Component Value Date   VALPROATE 18 (L) 09/09/2020   I have reviewed the images obtained: CT head IMPRESSION: There is no acute intracranial hemorrhage or evidence of acute infarction. ASPECT score is 10. No large vessel occlusion or hemodynamically significant stenosis.  CT angio head and neck: There is no acute intracranial  hemorrhage or evidence of acute infarction. ASPECT score is 10. No large vessel occlusion or hemodynamically significant stenosis.  Assessment: 86 year old female with history of Alzheimer's dementia with rapid memory decline and with mood disturbance moved to memory care unit yesterday with doubled dose of Depakote (250 mg daily) presents following syncope/near syncope event with initial systolic blood pressure of 70 for EMS. Varying symptoms present for 1-2 weeks with stumbling gait, tremor noted with walking and "drunk" feeling ambulation noted last night and a near syncopal event this morning with associated hypotension.  - Physical examination reveals stuporous Jaclyn Robertson with minimal interaction with examiner during neurologic examination and then suddenly opening eyes and following commands.  Question underlying behavioral disturbance with dementia. - CT imaging without evidence of acute intracranial abnormality, large vessel occlusion, or hemodynamically significant stenosis. - DDx includes obtundation from Depakote, seizure with post-ictal state, orthostasis, toxic metabolic encephalopathy, or ischemic etiology. rEEG, UA, and MRI brain without contrast pending. CBC without significant leukocytosis and afebrile on presentation. Ammonia pending for acute encephalopathy.  Impression: Alzheimer's dementia with mood disturbance and rapid memory decline and behavioral dysfunction especially in the setting of new placement into a memory disorders unit. Hypotension; possibly secondary to orthostasis Acute encephalopathy; consider toxic metabolic causes   Recommendations: - MRI brain without contrast - Orthostatic vital signs - Echocardiogram should help with cardiac evaluation but a full stroke work-up should only be done if MRI shows a stroke. - Frequent neuro checks - Risk factor modification - Telemetry monitoring - Neurology to follow   Lanae Boast, AGAC-NP [facility] Pager:  [phone]   Attending Neurohospitalist Addendum Jaclyn Robertson seen and examined with APP/Resident. Agree with the history and physical as documented above. Agree with the plan as documented, which I helped formulate. I have independently reviewed the chart, obtained history, review of systems and examined the Jaclyn Robertson.I have personally reviewed pertinent head/neck/spine imaging (CT/MRI). Please feel free to call with any questions. --- Milon Dikes, MD [facility] Pager: [phone]  If 7pm to 7am, please call on call as listed on AMION.

## 2020-09-09 NOTE — Code Documentation (Addendum)
Patient was admitted to [facility] in the memory care unit yesterday on 2/24. She was LKW at 1930 2/24 although daughter does recall during a walk yesterday afternoon patient had some unusual shaking of her hands. Pt did not sleep well last night. She has a hx of Alzheimer's, dementia, and mood disorder. She takes Xanax occasionally but did not have any today on 2/25. This am patient had a syncopal episode but did not hit her head. Staff called 911 and EMS arrived. She was noted to be hypotensive and given 700 mL of fluid. EMS brought patient to [facility]. Patient was roomed, given 1L of fluid and RN assessed AMS and limited verbal response. A code stroke was initiated in the ED. Pt was cleared by EDP and Stroke team arrived. Pt was taken for CT/CTA. Results below. NIHSS 11 (see documentation).  Patient is outside the window for TPA and per MD does not have an LVO therefore no acute treatment given. Care Plan: q2x12 then q4 vitals/neuro checks, MRI, EEG. Hand off with Lanora Manis, RN.   "IMPRESSION: There is no acute intracranial hemorrhage or evidence of acute infarction. ASPECT score is 10.  No large vessel occlusion or hemodynamically significant stenosis."   [place], [place] Scrape, RN  Stroke Response

## 2020-09-09 NOTE — ED Notes (Signed)
Spoke w/Dr. Rachael Darby.

## 2020-09-09 NOTE — ED Notes (Signed)
Stroke team remains at bedside

## 2020-09-09 NOTE — ED Notes (Signed)
Attempted to call report, unsuccessful, will call back. 

## 2020-09-09 NOTE — ED Notes (Signed)
EDP at bedside to speak with pt dtr.

## 2020-09-09 NOTE — ED Notes (Signed)
Requested NS to report to IT that sunquest printer is not functioning in room 12.

## 2020-09-09 NOTE — Progress Notes (Signed)
Called by RN that family member wanted to see MD to get update and results of MRI.  Met with Ms. Jaclyn Robertson and her daughter who is at bedside. Reviewed MRI results.  Pt not able to do swallow study earlier due to lethargy and decreased responsiveness. Pt is now alert and talkative and will have RN retry to do swallow screening. If passes will start po intake

## 2020-09-09 NOTE — ED Notes (Signed)
Patient transported to MRI 

## 2020-09-09 NOTE — Progress Notes (Signed)
  Echocardiogram 2D Echocardiogram has been performed.  Pieter Partridge 09/09/2020, 1:23 PM

## 2020-09-09 NOTE — Progress Notes (Signed)
EEG complete - results pending 

## 2020-09-09 NOTE — Procedures (Signed)
Patient Name: Jaclyn Robertson  MRN: [id number]  Epilepsy Attending: Charlsie Quest  Referring Physician/Provider: Dr Jonah Blue Date: 09/09/2020 Duration: 26.25 mins  Patient history: 86 year old female with history of Alzheimer's dementia with rapid memory decline and with mood disturbance moved to memory care unit yesterday with doubled dose of Depakote (250 mg daily) presents following syncope/near syncope event with initial systolic blood pressure of 70 for EMS. Varying symptoms present for 1-2 weeks with stumbling gait, tremor noted with walking and "drunk" feeling ambulation noted last night and a near syncopal event this morning with associated hypotension. EEG to evaluate for seizure.   Level of alertness: Awake, asleep  AEDs during EEG study: VPA  Technical aspects: This EEG study was done with scalp electrodes positioned according to the 10-20 International system of electrode placement. Electrical activity was acquired at a sampling rate of 500Hz  and reviewed with a high frequency filter of 70Hz  and a low frequency filter of 1Hz . EEG data were recorded continuously and digitally stored.   Description: The posterior dominant rhythm consists of 9 Hz activity of moderate voltage (25-35 uV) seen predominantly in posterior head regions, symmetric and reactive to eye opening and eye closing. Sleep was characterized by vertex waves, sleep spindles (12 to 14 Hz), maximal frontocentral region. EEG showed intermittent generalized 3-5Hz  theta-delta slowing. Hyperventilation and photic stimulation were not performed.     ABNORMALITY -Intermittent slow, generalized  IMPRESSION: This study is suggestive of mild diffuse encephalopathy, nonspecific etiology. No seizures or epileptiform discharges were seen throughout the recording.  Michelena Culmer 

## 2020-09-09 NOTE — ED Notes (Signed)
Pt arrived in room from CT 

## 2020-09-09 NOTE — ED Provider Notes (Signed)
[facility] EMERGENCY DEPARTMENT Provider Note   CSN: [id number] Arrival date & time: 09/09/20  4098  An emergency department physician performed an initial assessment on this suspected stroke patient at 107.  History Chief Complaint  Patient presents with  . Loss of Consciousness    Jaclyn Robertson is a 86 y.o. female w/ hx of alzheimer's dementia presenting from [place] at [facility] nursing home with concern for syncope vs near syncope.  Patient has dementia and cannot provide history.    Ms DeVon healthcare director at [facility] tells me at baseline patient is ambulatory without assistance.  Today she was "walking and got glazed look in her eyes and leaned to the right into the wall and slid to the floor."  The patient took a xanax 5 minutes prior to the incident.  Patient admitted yesterday evening.    EMS reports patient's BP was 70 systolic on arrival.  She was verbalizing with them.  En route to the hospital her BP improved.    Neurology notes reviewed from Dr Karel Jarvis, 05/13/20, noting progressively worsening alzheimer's dementia.  Patient only recently placed in nursing facility.  Patient's daughter now at bedside telling me the patient is normally ambulatory and talking clearly.  Yesterday she complained about "feeling drunk," but was also experiencing significant anxiety moving into her [place] facility.  She takes xanax regularly for anxiety and has never had this kind of reaction before.  She has no hx of strokes.  HPI     Past Medical History:  Diagnosis Date  . Dementia [facility])     Patient Active Problem List   Diagnosis Date Noted  . Syncope and collapse 09/09/2020  . Age-related osteoporosis without current pathological fracture 11/02/2019  . Compression deformity of vertebra 09/29/2019  . Late onset Alzheimer's disease without behavioral disturbance (HCC) 11/18/2017    Past Surgical History:  Procedure Laterality Date  . CATARACT EXTRACTION  W/PHACO Left 09/13/2017   Procedure: CATARACT EXTRACTION PHACO AND INTRAOCULAR LENS PLACEMENT (IOC);  Surgeon: Fabio Pierce, MD;  Location: [facility];  Service: Ophthalmology;  Laterality: Left;  CDE: 4.81  . CATARACT EXTRACTION W/PHACO Right 11/15/2017   Procedure: CATARACT EXTRACTION PHACO AND INTRAOCULAR LENS PLACEMENT (IOC);  Surgeon: Fabio Pierce, MD;  Location: [facility];  Service: Ophthalmology;  Laterality: Right;  CDE: 5.96     OB History   No obstetric history on file.     Family History  Problem Relation Age of Onset  . Seizures Mother     Social History   Tobacco Use  . Smoking status: Never Smoker  . Smokeless tobacco: Never Used  Vaping Use  . Vaping Use: Never used  Substance Use Topics  . Alcohol use: Yes    Alcohol/week: 2.0 standard drinks    Types: 2 Glasses of wine per week    Comment: daily - less in the last few weeks  . Drug use: No    Home Medications Prior to Admission medications   Medication Sig Start Date End Date Taking? Authorizing Provider  ALPRAZolam [name]) 1 MG tablet Take 1 mg by mouth 2 (two) times daily.   Yes [provider]  Cholecalciferol (VITAMIN D-3) 125 MCG (5000 UT) TABS Take 1 tablet by mouth daily. Patient taking differently: Take 5,000 Units by mouth daily. 10/06/19  Yes Hilts, Casimiro Needle, MD  divalproex (DEPAKOTE) 125 MG DR tablet Take 250 mg by mouth daily.   Yes [provider]  escitalopram (LEXAPRO) 20 MG tablet Take 1.5 tablets  daily Patient taking differently: Take 30 mg by mouth daily. 07/01/20  Yes Van Clines, MD  memantine (NAMENDA) 10 MG tablet Take 1 tablet (10 mg total) by mouth 2 (two) times daily. 05/13/20  Yes Van Clines, MD  Menatetrenone (VITAMIN K2) 100 MCG TABS Take 100 mcg by mouth daily. 10/06/19  Yes Hilts, Casimiro Needle, MD  raloxifene (EVISTA) 60 MG tablet Take 60 mg by mouth once daily Patient taking differently: Take 60 mg by mouth daily. 11/02/19  Yes Mechele Claude, MD  traZODone  (DESYREL) 100 MG tablet Take 1 tablet (100 mg total) by mouth at bedtime. 08/15/20  Yes Van Clines, MD    Allergies    Levaquin [levofloxacin in d5w]  Review of Systems   Review of Systems  Unable to perform ROS: Dementia (level 5 caveat)    Physical Exam Updated Vital Signs BP 139/76   Pulse (!) 50   Temp (!) 97.4 F (36.3 C)   Resp 17   Ht 5' 5.5" (1.664 m)   Wt 56 kg   SpO2 100%   BMI 20.23 kg/m   Physical Exam Constitutional:      General: She is not in acute distress.    Comments: Somnolent, eyes closed  HENT:     Head: Normocephalic and atraumatic.  Eyes:     Conjunctiva/sclera: Conjunctivae normal.     Pupils: Pupils are equal, round, and reactive to light.  Cardiovascular:     Rate and Rhythm: Regular rhythm. Bradycardia present.     Comments: HR 50-55 bpm, regular Pulmonary:     Effort: Pulmonary effort is normal. No respiratory distress.  Abdominal:     General: There is no distension.     Tenderness: There is no abdominal tenderness.  Skin:    General: Skin is warm and dry.  Neurological:     General: No focal deficit present.     Mental Status: Mental status is at baseline.     GCS: GCS eye subscore is 2. GCS verbal subscore is 4. GCS motor subscore is 4.     Cranial Nerves: No facial asymmetry.     Comments: Does not follow commands, somnolent Noted to be moving all extremities to pain     ED Results / Procedures / Treatments   Labs (all labs ordered are listed, but only abnormal results are displayed) Labs Reviewed  BASIC METABOLIC PANEL - Abnormal; Notable for the following components:      Result Value   Creatinine, Ser 1.16 (*)    Calcium 8.1 (*)    GFR, Estimated 47 (*)    All other components within normal limits  CBC WITH DIFFERENTIAL/PLATELET - Abnormal; Notable for the following components:   RBC 3.47 (*)    Hemoglobin 11.3 (*)    HCT 34.2 (*)    All other components within normal limits  URINALYSIS, ROUTINE W REFLEX  MICROSCOPIC - Abnormal; Notable for the following components:   Color, Urine STRAW (*)    All other components within normal limits  VALPROIC ACID LEVEL - Abnormal; Notable for the following components:   Valproic Acid Lvl 18 (*)    All other components within normal limits  COMPREHENSIVE METABOLIC PANEL - Abnormal; Notable for the following components:   Creatinine, Ser 1.07 (*)    Calcium 7.7 (*)    Total Protein 5.4 (*)    Albumin 3.0 (*)    GFR, Estimated 52 (*)    All other components within normal limits  RAPID URINE DRUG SCREEN, HOSP PERFORMED - Abnormal; Notable for the following components:   Benzodiazepines POSITIVE (*)    All other components within normal limits  I-STAT CHEM 8, ED - Abnormal; Notable for the following components:   Calcium, Ion 0.97 (*)    All other components within normal limits  RESP PANEL BY RT-PCR (FLU A&B, COVID) ARPGX2  AMMONIA  PROTIME-INR  APTT  HEMOGLOBIN A1C  LIPID PANEL  TSH  BASIC METABOLIC PANEL  CBC  TROPONIN I (HIGH SENSITIVITY)  TROPONIN I (HIGH SENSITIVITY)    EKG EKG Interpretation  Date/Time:  Friday September 09 2020 10:31:02 EST Ventricular Rate:  66 PR Interval:    QRS Duration: 93 QT Interval:  478 QTC Calculation: 501 R Axis:   66 Text Interpretation: Sinus arrhythmia Borderline T abnormalities, anterior leads Prolonged QT interval No STEMI Confirmed by Alvester Chou [phone]) on 09/09/2020 11:17:21 AM   Radiology CT Code Stroke CTA Head W/WO contrast  Result Date: 09/09/2020 CLINICAL DATA:  Code stroke EXAM: CT ANGIOGRAPHY HEAD AND NECK CT HEAD WITHOUT CONTRAST TECHNIQUE: Multidetector CT imaging of the head and neck was performed using the standard protocol during bolus administration of intravenous contrast. Multiplanar CT image reconstructions and MIPs were obtained to evaluate the vascular anatomy. Carotid stenosis measurements (when applicable) are obtained utilizing NASCET criteria, using the distal internal  carotid diameter as the denominator. CONTRAST:  50mL OMNIPAQUE IOHEXOL 350 MG/ML SOLN COMPARISON:  None. FINDINGS: CT HEAD FINDINGS Brain: No acute intracranial hemorrhage, mass effect, or edema. Gray-white differentiation is preserved. Prominence of the ventricles and sulci reflects generalized parenchymal volume loss. Patchy hypoattenuation in the supratentorial white matter is nonspecific but probably reflects mild to moderate chronic microvascular ischemic changes. There is no extra-axial collection. Vascular: No hyperdense vessel. Skull: Unremarkable. Sinuses/Orbits: Minor mucosal thickening. Bilateral lens replacements. Other: Mastoid air cells are clear. ASPECTS (Alberta Stroke Program Early CT Score) - Ganglionic level infarction (caudate, lentiform nuclei, internal capsule, insula, M1-M3 cortex): 7 - Supraganglionic infarction (M4-M6 cortex): 3 Total score (0-10 with 10 being normal): 10 Review of the MIP images confirms the above findings CTA NECK FINDINGS Aortic arch: Mixed plaque along the visualized arch and patent great vessel origins. Right carotid system: Patent. No measurable stenosis at the ICA origin. Left carotid system: Patent. Trace calcified plaque at the ICA origin. No measurable stenosis. Vertebral arteries: Patent.  Left vertebral artery is dominant. Skeleton: Cervical spine degenerative changes. Other neck: Negative. Upper chest: No apical lung mass. Review of the MIP images confirms the above findings CTA HEAD FINDINGS Anterior circulation: Intracranial internal carotid arteries are patent with mild calcified plaque. Anterior cerebral arteries are patent with patent anterior communicating artery. Middle cerebral arteries are patent. Posterior circulation: Intracranial vertebral arteries, basilar artery, and posterior cerebral arteries are patent. Major cerebellar artery branch origins are patent. Venous sinuses: As permitted by contrast timing, patent. Review of the MIP images confirms  the above findings IMPRESSION: There is no acute intracranial hemorrhage or evidence of acute infarction. ASPECT score is 10. No large vessel occlusion or hemodynamically significant stenosis. Initial results were provided by telephone at the time of interpretation on 09/09/2020 at 10:52 am to provider [facility] , who verbally acknowledged these results. Electronically Signed   By: Guadlupe Spanish M.D.   On: 09/09/2020 11:13   CT Code Stroke CTA Neck W/WO contrast  Result Date: 09/09/2020 CLINICAL DATA:  Code stroke EXAM: CT ANGIOGRAPHY HEAD AND NECK CT HEAD WITHOUT CONTRAST TECHNIQUE: Multidetector CT imaging  of the head and neck was performed using the standard protocol during bolus administration of intravenous contrast. Multiplanar CT image reconstructions and MIPs were obtained to evaluate the vascular anatomy. Carotid stenosis measurements (when applicable) are obtained utilizing NASCET criteria, using the distal internal carotid diameter as the denominator. CONTRAST:  39mL OMNIPAQUE IOHEXOL 350 MG/ML SOLN COMPARISON:  None. FINDINGS: CT HEAD FINDINGS Brain: No acute intracranial hemorrhage, mass effect, or edema. Gray-white differentiation is preserved. Prominence of the ventricles and sulci reflects generalized parenchymal volume loss. Patchy hypoattenuation in the supratentorial white matter is nonspecific but probably reflects mild to moderate chronic microvascular ischemic changes. There is no extra-axial collection. Vascular: No hyperdense vessel. Skull: Unremarkable. Sinuses/Orbits: Minor mucosal thickening. Bilateral lens replacements. Other: Mastoid air cells are clear. ASPECTS (Alberta Stroke Program Early CT Score) - Ganglionic level infarction (caudate, lentiform nuclei, internal capsule, insula, M1-M3 cortex): 7 - Supraganglionic infarction (M4-M6 cortex): 3 Total score (0-10 with 10 being normal): 10 Review of the MIP images confirms the above findings CTA NECK FINDINGS Aortic arch: Mixed  plaque along the visualized arch and patent great vessel origins. Right carotid system: Patent. No measurable stenosis at the ICA origin. Left carotid system: Patent. Trace calcified plaque at the ICA origin. No measurable stenosis. Vertebral arteries: Patent.  Left vertebral artery is dominant. Skeleton: Cervical spine degenerative changes. Other neck: Negative. Upper chest: No apical lung mass. Review of the MIP images confirms the above findings CTA HEAD FINDINGS Anterior circulation: Intracranial internal carotid arteries are patent with mild calcified plaque. Anterior cerebral arteries are patent with patent anterior communicating artery. Middle cerebral arteries are patent. Posterior circulation: Intracranial vertebral arteries, basilar artery, and posterior cerebral arteries are patent. Major cerebellar artery branch origins are patent. Venous sinuses: As permitted by contrast timing, patent. Review of the MIP images confirms the above findings IMPRESSION: There is no acute intracranial hemorrhage or evidence of acute infarction. ASPECT score is 10. No large vessel occlusion or hemodynamically significant stenosis. Initial results were provided by telephone at the time of interpretation on 09/09/2020 at 10:52 am to provider [facility] , who verbally acknowledged these results. Electronically Signed   By: Guadlupe Spanish M.D.   On: 09/09/2020 11:13   DG Chest Portable 1 View  Result Date: 09/09/2020 CLINICAL DATA:  Syncope. EXAM: PORTABLE CHEST 1 VIEW COMPARISON:  None. FINDINGS: Midline trachea. Mild cardiomegaly. No right and no definite left pleural effusion. Breast tissue projects over the left costophrenic angle. Biapical pleural thickening. Possible peripheral right lower lung airspace disease. Left lung base not well evaluated. IMPRESSION: Possible peripheral right lower lobe airspace disease, for which pneumonia cannot be excluded. Suboptimal evaluation of the left lung base and left pleural  space due to overlying breast tissue. If feasible, recommend further evaluation with PA and lateral radiographs. Cardiomegaly without congestive failure. Electronically Signed   By: Jeronimo Greaves M.D.   On: 09/09/2020 10:27   ECHOCARDIOGRAM COMPLETE  Result Date: 09/09/2020    ECHOCARDIOGRAM REPORT   Patient Name:   FRAYDA EGLEY Date of Exam: 09/09/2020 Medical Rec #:  [id number]     Height:       65.5 in Accession #:    [id number]    Weight:       124.6 lb Date of Birth:  [birth year]    BSA:          1.627 m Patient Age:    85 years      BP:  150/78 mmHg Patient Gender: F             HR:           59 bpm. Exam Location:  Inpatient Procedure: 2D Echo, Cardiac Doppler and Color Doppler Indications:    Syncope  History:        Patient has no prior history of Echocardiogram examinations.                 Signs/Symptoms:Alzheimer's and Syncope.  Sonographer:    Lavenia AtlasBrooke Strickland Referring Phys: [id number] Kara MeadSTEVI W TOBERMAN IMPRESSIONS  1. Left ventricular ejection fraction, by estimation, is 60 to 65%. The left ventricle has normal function. The left ventricle has no regional wall motion abnormalities. Left ventricular diastolic parameters are consistent with Grade I diastolic dysfunction (impaired relaxation).  2. Right ventricular systolic function is normal. The right ventricular size is normal. There is normal pulmonary artery systolic pressure. The estimated right ventricular systolic pressure is 35.3 mmHg.  3. The mitral valve is normal in structure. Mild mitral valve regurgitation. No evidence of mitral stenosis.  4. The aortic valve is normal in structure. Aortic valve regurgitation is mild. No aortic stenosis is present.  5. The inferior vena cava is normal in size with greater than 50% respiratory variability, suggesting right atrial pressure of 3 mmHg. FINDINGS  Left Ventricle: Left ventricular ejection fraction, by estimation, is 60 to 65%. The left ventricle has normal function. The left ventricle has  no regional wall motion abnormalities. The left ventricular internal cavity size was normal in size. There is  no left ventricular hypertrophy. Left ventricular diastolic parameters are consistent with Grade I diastolic dysfunction (impaired relaxation). Right Ventricle: The right ventricular size is normal. No increase in right ventricular wall thickness. Right ventricular systolic function is normal. There is normal pulmonary artery systolic pressure. The tricuspid regurgitant velocity is 2.84 m/s, and  with an assumed right atrial pressure of 3 mmHg, the estimated right ventricular systolic pressure is 35.3 mmHg. Left Atrium: Left atrial size was normal in size. Right Atrium: Right atrial size was normal in size. Pericardium: There is no evidence of pericardial effusion. Mitral Valve: The mitral valve is normal in structure. Mild mitral valve regurgitation. No evidence of mitral valve stenosis. Tricuspid Valve: The tricuspid valve is normal in structure. Tricuspid valve regurgitation is mild . No evidence of tricuspid stenosis. Aortic Valve: The aortic valve is normal in structure. Aortic valve regurgitation is mild. Aortic regurgitation PHT measures 812 msec. No aortic stenosis is present. Pulmonic Valve: The pulmonic valve was normal in structure. Pulmonic valve regurgitation is not visualized. No evidence of pulmonic stenosis. Aorta: The aortic root is normal in size and structure. Venous: The inferior vena cava is normal in size with greater than 50% respiratory variability, suggesting right atrial pressure of 3 mmHg. IAS/Shunts: No atrial level shunt detected by color flow Doppler.  LEFT VENTRICLE PLAX 2D LVIDd:         3.90 cm  Diastology LVIDs:         3.00 cm  LV e' medial:    5.11 cm/s LV PW:         1.00 cm  LV E/e' medial:  9.6 LV IVS:        1.00 cm  LV e' lateral:   6.42 cm/s LVOT diam:     2.00 cm  LV E/e' lateral: 7.7 LV SV:         74 LV SV Index:   45 LVOT  Area:     3.14 cm  RIGHT VENTRICLE RV  Basal diam:  2.40 cm RV S prime:     12.10 cm/s TAPSE (M-mode): 2.7 cm LEFT ATRIUM             Index       RIGHT ATRIUM           Index LA diam:        3.00 cm 1.84 cm/m  RA Area:     14.30 cm LA Vol (A2C):   26.9 ml 16.53 ml/m RA Volume:   33.90 ml  20.84 ml/m LA Vol (A4C):   26.2 ml 16.10 ml/m LA Biplane Vol: 28.8 ml 17.70 ml/m  AORTIC VALVE LVOT Vmax:   96.60 cm/s LVOT Vmean:  63.600 cm/s LVOT VTI:    0.234 m AI PHT:      812 msec  AORTA Ao Root diam: 2.90 cm MITRAL VALVE               TRICUSPID VALVE MV Area (PHT): 2.29 cm    TR Peak grad:   32.3 mmHg MV Decel Time: 331 msec    TR Vmax:        284.00 cm/s MV E velocity: 49.30 cm/s MV A velocity: 93.80 cm/s  SHUNTS MV E/A ratio:  0.53        Systemic VTI:  0.23 m                            Systemic Diam: 2.00 cm Donato Schultz MD Electronically signed by Donato Schultz MD Signature Date/Time: 09/09/2020/2:58:12 PM    Final    CT HEAD CODE STROKE WO CONTRAST  Result Date: 09/09/2020 CLINICAL DATA:  Code stroke EXAM: CT ANGIOGRAPHY HEAD AND NECK CT HEAD WITHOUT CONTRAST TECHNIQUE: Multidetector CT imaging of the head and neck was performed using the standard protocol during bolus administration of intravenous contrast. Multiplanar CT image reconstructions and MIPs were obtained to evaluate the vascular anatomy. Carotid stenosis measurements (when applicable) are obtained utilizing NASCET criteria, using the distal internal carotid diameter as the denominator. CONTRAST:  67mL OMNIPAQUE IOHEXOL 350 MG/ML SOLN COMPARISON:  None. FINDINGS: CT HEAD FINDINGS Brain: No acute intracranial hemorrhage, mass effect, or edema. Gray-white differentiation is preserved. Prominence of the ventricles and sulci reflects generalized parenchymal volume loss. Patchy hypoattenuation in the supratentorial white matter is nonspecific but probably reflects mild to moderate chronic microvascular ischemic changes. There is no extra-axial collection. Vascular: No hyperdense vessel. Skull:  Unremarkable. Sinuses/Orbits: Minor mucosal thickening. Bilateral lens replacements. Other: Mastoid air cells are clear. ASPECTS (Alberta Stroke Program Early CT Score) - Ganglionic level infarction (caudate, lentiform nuclei, internal capsule, insula, M1-M3 cortex): 7 - Supraganglionic infarction (M4-M6 cortex): 3 Total score (0-10 with 10 being normal): 10 Review of the MIP images confirms the above findings CTA NECK FINDINGS Aortic arch: Mixed plaque along the visualized arch and patent great vessel origins. Right carotid system: Patent. No measurable stenosis at the ICA origin. Left carotid system: Patent. Trace calcified plaque at the ICA origin. No measurable stenosis. Vertebral arteries: Patent.  Left vertebral artery is dominant. Skeleton: Cervical spine degenerative changes. Other neck: Negative. Upper chest: No apical lung mass. Review of the MIP images confirms the above findings CTA HEAD FINDINGS Anterior circulation: Intracranial internal carotid arteries are patent with mild calcified plaque. Anterior cerebral arteries are patent with patent anterior communicating artery. Middle cerebral arteries are patent. Posterior circulation: Intracranial vertebral  arteries, basilar artery, and posterior cerebral arteries are patent. Major cerebellar artery branch origins are patent. Venous sinuses: As permitted by contrast timing, patent. Review of the MIP images confirms the above findings IMPRESSION: There is no acute intracranial hemorrhage or evidence of acute infarction. ASPECT score is 10. No large vessel occlusion or hemodynamically significant stenosis. Initial results were provided by telephone at the time of interpretation on 09/09/2020 at 10:52 am to provider [facility] , who verbally acknowledged these results. Electronically Signed   By: Guadlupe Spanish M.D.   On: 09/09/2020 11:13    Procedures Procedures   Medications Ordered in ED Medications  escitalopram (LEXAPRO) tablet 30 mg (has no  administration in time range)  memantine (NAMENDA) tablet 10 mg (has no administration in time range)  divalproex (DEPAKOTE) DR tablet 125 mg (has no administration in time range)  enoxaparin (LOVENOX) injection 40 mg (has no administration in time range)  sodium chloride flush (NS) 0.9 % injection 3 mL (3 mLs Intravenous Given 09/09/20 1736)  lactated ringers infusion (has no administration in time range)  acetaminophen (TYLENOL) tablet 650 mg (has no administration in time range)    Or  acetaminophen (TYLENOL) suppository 650 mg (has no administration in time range)  ondansetron (ZOFRAN) tablet 4 mg (has no administration in time range)    Or  ondansetron (ZOFRAN) injection 4 mg (has no administration in time range)  aspirin EC tablet 81 mg (81 mg Oral Not Given 09/09/20 1722)  sodium chloride 0.9 % bolus 1,000 mL (0 mLs Intravenous Stopped 09/09/20 1100)  iohexol (OMNIPAQUE) 350 MG/ML injection 50 mL (50 mLs Intravenous Contrast Given 09/09/20 1044)    ED Course  I have reviewed the triage vital signs and the nursing notes.  Pertinent labs & imaging results that were available during my care of the patient were reviewed by me and considered in my medical decision making (see chart for details).  This patient presents to the Emergency Department with complaint of altered mental status.  This involves an extensive number of treatment options, and is a complaint that carries with it a high risk of complications and morbidity.  The differential diagnosis includes hypoglycemia vs metabolic encephalopathy vs infection (including cystitis) vs ICH vs stroke vs polypharmacy vs worsening dementia vs other  Accucheck normal on arrival  I ordered, reviewed, and interpreted labs, including UA without sign of infection, BMP and CBC largely unremarkable (mild anemia hgb 11.3 near baseline), trop 7 - > 7, covid negative.  UDS positive for known benzo use (xanax).  Ammonia 20. I ordered medication IV fluids  for possible dehydration I ordered imaging studies which included CTA head and neck (stroke protocol) and dg chest I independently visualized and interpreted imaging which showed no evidence of acute CVA or large vessel occlusion of head or neck, chest xray with cardiomegaly and likely atelectasis and the monitor tracing which showed  sinus bradycardia HR 50 Additional history was obtained from staff at nursing facility and patient's daughter at bedside I personally reviewed the patients ECG which showed sinus bradycardia rhythm with no acute ischemic findings, no heart block  I consulted neurology and discussed lab and imaging findings.  With the onset of her symptoms less than 2 hours prior to arrival, and an unreliable neurological exam, I felt a stroke/CVA evaluation was needed.    Clinical Course as of 09/09/20 1755  Fri Sep 09, 2020  1014 I spoke to the patient's daughter at bedside who reports concerns for possible  weakness of one side, although she is not sure which side.  She wonders about a stroke.  As the patient is somnolent and unresponsive I am unable to get a reliable exam.  However I've asked CT to expedite her scan as her symptom onset was 2 hours ago. [MT]  1016 No prior hx of strokes.  Last MRI brain in 2018 without infarct. [MT]  1027 Patient activated as code stroke after I spoke to Dr Wilford Corner from neurology by phone, given timing of symptom onset and unreliable neurological exam.  Dr Wilford Corner at bedside now with patient's daughter.  CT ready for her scans. [MT]  1036 ECG with NSR per my interpretation, Qtc mildly prolonged at 501.  No STEMI or acute ischemic findings. [MT]  1111 No acute infarct noted on imaging per Dr Wilford Corner.  He recommends continued medical workup and admission for MRI.  I would also recommend echo for near syncope.  We are awaiting several other labs [MT]  1117 IMPRESSION: There is no acute intracranial hemorrhage or evidence of acute infarction. ASPECT score is  10.  No large vessel occlusion or hemodynamically significant stenosis.  Initial results were provided by telephone at the time of interpretation on 09/09/2020 at 10:52 am to provider [facility] , who verbally acknowledged these results.  [MT]  1240 Patient reassessed, continues to lie in bed with eyes closed.  Daughter updated regarding workup, plan to admit for echocardiogram and MRI to complete syncope and TIA workup.  However this does appear more consistent with a metabolic encephalopathy or medication side effect (depakote dose increase?), or possibly behavioral disturbance related to her dementia.   [MT]  1241 UA unremarkable.  Trop and ECG not suggestive of ACS.  No fever or photophobia or leukocytosis to suggest meningitis.  Covid is negative. [MT]  1308 Paged for admission unassigned [MT]  1316 Signed out to hospital service [MT]    Clinical Course User Index [MT] Neysha Criado, Kermit Balo, MD    Final Clinical Impression(s) / ED Diagnoses Final diagnoses:  Near syncope  Altered mental status, unspecified altered mental status type    Rx / DC Orders ED Discharge Orders    None       Spencer Cardinal, Kermit Balo, MD 09/09/20 1755

## 2020-09-09 NOTE — H&P (Signed)
History and Physical    Jaclyn Robertson:[id number] DOB: [birth year] DOA: 09/09/2020  PCP: Mechele Claude, MD Consultants:  Karel Jarvis - neurology; Hilts - orthopedics Patient coming from:  Home with her husband with a caregiver until yesterday, then moved into memory care at [facility]; NOK: Daughter, [phone]  Chief Complaint: Syncope  HPI: Jaclyn Robertson is a 86 y.o. female with medical history significant of dementia presenting with syncope.  Her daughter was with her all day yesterday.  It was a very stressful day but she ate fine and walked around.  She had lots of questions and anxiety.  After dinner. She stumbled a bit and her hands looked somewhat shaky and then it stopped.  She slept all night and got up this AM for break\fast.  After breakfast they noticed her with a wide gait.  She didn't fall but leaned against the wall and "went down."  She was unconscious but still breathing.  She appeared to have UL weakness.  Her neurologist had prescribed 250 mg Depakote but they were not giving that dose at home - it was increased to this dose yesterday.  She has had some hallucinations but no aggression.  Her brother died a few months ago and she had significant progression since.    ED Course:  Syncope/TIA evaluation.  Progressive dementia, placed yesterday.  Episode today, witnessed episode of LOC with leaning against the wall.  Hypotensive with EMS.  Somnolent now, different from baseline.  Called code stroke, appears to be negative.  MRI recommended by neurology.  Orthostatics normal.  Likely needs obs, echo.  Depakote increased yesterday, on Xanax prn for years.  Review of Systems: Unable to perform Ambulatory Status:  Ambulates without assistance  COVID Vaccine Status:  Complete plus booster  Past Medical History:  Diagnosis Date  . Dementia [facility])     Past Surgical History:  Procedure Laterality Date  . CATARACT EXTRACTION W/PHACO Left 09/13/2017   Procedure: CATARACT  EXTRACTION PHACO AND INTRAOCULAR LENS PLACEMENT (IOC);  Surgeon: Fabio Pierce, MD;  Location: [facility];  Service: Ophthalmology;  Laterality: Left;  CDE: 4.81  . CATARACT EXTRACTION W/PHACO Right 11/15/2017   Procedure: CATARACT EXTRACTION PHACO AND INTRAOCULAR LENS PLACEMENT (IOC);  Surgeon: Fabio Pierce, MD;  Location: [facility];  Service: Ophthalmology;  Laterality: Right;  CDE: 5.96    Social History   Socioeconomic History  . Marital status: Married    Spouse name: Not on file  . Number of children: Not on file  . Years of education: Not on file  . Highest education level: Not on file  Occupational History  . Occupation: retired  Tobacco Use  . Smoking status: Never Smoker  . Smokeless tobacco: Never Used  Vaping Use  . Vaping Use: Never used  Substance and Sexual Activity  . Alcohol use: Yes    Alcohol/week: 2.0 standard drinks    Types: 2 Glasses of wine per week    Comment: daily - less in the last few weeks  . Drug use: No  . Sexual activity: Not Currently    Birth control/protection: Post-menopausal  Other Topics Concern  . Not on file  Social History Narrative   Lives with husband in a two story home      Left handed      Highest level of edu- bachelor's   Social Determinants of Health   Financial Resource Strain: Not on file  Food Insecurity: Not on file  Transportation Needs: Not on file  Physical  Activity: Not on file  Stress: Not on file  Social Connections: Not on file  Intimate Partner Violence: Not on file    Allergies  Allergen Reactions  . Levaquin [Levofloxacin In D5w] Rash    Family History  Problem Relation Age of Onset  . Seizures Mother     Prior to Admission medications   Medication Sig Start Date End Date Taking? Authorizing Provider  Cholecalciferol (VITAMIN D-3) 125 MCG (5000 UT) TABS Take 1 tablet by mouth daily. 10/06/19   Hilts, Casimiro Needle, MD  divalproex (DEPAKOTE SPRINKLE) 125 MG capsule Give 1 capsule every evening 07/25/20    Van Clines, MD  escitalopram [facility]) 20 MG tablet Take 1.5 tablets daily 07/01/20   Van Clines, MD  Magnesium Oxide 400 (240 Mg) MG TABS TAKE 1 TABLET DAILY 05/12/20   Hilts, Casimiro Needle, MD  memantine (NAMENDA) 10 MG tablet Take 1 tablet (10 mg total) by mouth 2 (two) times daily. 05/13/20   Van Clines, MD  Menatetrenone (VITAMIN K2) 100 MCG TABS Take 100 mcg by mouth daily. 10/06/19   Hilts, Casimiro Needle, MD  raloxifene (EVISTA) 60 MG tablet Take 60 mg by mouth once daily 11/02/19   Mechele Claude, MD  traZODone (DESYREL) 100 MG tablet Take 1 tablet (100 mg total) by mouth at bedtime. 08/15/20   Van Clines, MD    Physical Exam: Vitals:   09/09/20 1130 09/09/20 1230 09/09/20 1245 09/09/20 1500  BP: 128/84 (!) 132/113 (!) 150/78   Pulse: 64 65 (!) 59   Resp: Temp:      TempSrc:      SpO2: 99% 100% 100%   Weight:    56 kg  Height:    5' 5.5" (1.664 m)     . General:  Appears somnolent to catatonic . Eyes:  Constricted pupils, normal lids, iris . ENT:  grossly normal lips & tongue, mmm . Neck:  no LAD, masses or thyromegaly . Cardiovascular:  RRR, no m/r/g. No LE edema.  Marland Kitchen Respiratory:   CTA bilaterally with no wheezes/rales/rhonchi.  Normal respiratory effort. . Abdomen:  soft, NT, ND, NABS . Skin:  no rash or induration seen on limited exam . Musculoskeletal:   no bony abnormality . Psychiatric:  Essentially catatonic . Neurologic:  Unable to perform    Radiological Exams on Admission: Independently reviewed - see discussion in A/P where applicable  CT Code Stroke CTA Head W/WO contrast  Result Date: 09/09/2020 CLINICAL DATA:  Code stroke EXAM: CT ANGIOGRAPHY HEAD AND NECK CT HEAD WITHOUT CONTRAST TECHNIQUE: Multidetector CT imaging of the head and neck was performed using the standard protocol during bolus administration of intravenous contrast. Multiplanar CT image reconstructions and MIPs were obtained to evaluate the vascular anatomy. Carotid  stenosis measurements (when applicable) are obtained utilizing NASCET criteria, using the distal internal carotid diameter as the denominator. CONTRAST:  50mL OMNIPAQUE IOHEXOL 350 MG/ML SOLN COMPARISON:  None. FINDINGS: CT HEAD FINDINGS Brain: No acute intracranial hemorrhage, mass effect, or edema. Gray-white differentiation is preserved. Prominence of the ventricles and sulci reflects generalized parenchymal volume loss. Patchy hypoattenuation in the supratentorial white matter is nonspecific but probably reflects mild to moderate chronic microvascular ischemic changes. There is no extra-axial collection. Vascular: No hyperdense vessel. Skull: Unremarkable. Sinuses/Orbits: Minor mucosal thickening. Bilateral lens replacements. Other: Mastoid air cells are clear. ASPECTS [facility] Stroke Program Early CT Score) - Ganglionic level infarction (caudate, lentiform nuclei, internal capsule, insula, M1-M3 cortex): 7 - Supraganglionic infarction (  M4-M6 cortex): 3 Total score (0-10 with 10 being normal): 10 Review of the MIP images confirms the above findings CTA NECK FINDINGS Aortic arch: Mixed plaque along the visualized arch and patent great vessel origins. Right carotid system: Patent. No measurable stenosis at the ICA origin. Left carotid system: Patent. Trace calcified plaque at the ICA origin. No measurable stenosis. Vertebral arteries: Patent.  Left vertebral artery is dominant. Skeleton: Cervical spine degenerative changes. Other neck: Negative. Upper chest: No apical lung mass. Review of the MIP images confirms the above findings CTA HEAD FINDINGS Anterior circulation: Intracranial internal carotid arteries are patent with mild calcified plaque. Anterior cerebral arteries are patent with patent anterior communicating artery. Middle cerebral arteries are patent. Posterior circulation: Intracranial vertebral arteries, basilar artery, and posterior cerebral arteries are patent. Major cerebellar artery branch origins  are patent. Venous sinuses: As permitted by contrast timing, patent. Review of the MIP images confirms the above findings IMPRESSION: There is no acute intracranial hemorrhage or evidence of acute infarction. ASPECT score is 10. No large vessel occlusion or hemodynamically significant stenosis. Initial results were provided by telephone at the time of interpretation on 09/09/2020 at 10:52 am to provider [facility] , who verbally acknowledged these results. Electronically Signed   By: Guadlupe Spanish M.D.   On: 09/09/2020 11:13   CT Code Stroke CTA Neck W/WO contrast  Result Date: 09/09/2020 CLINICAL DATA:  Code stroke EXAM: CT ANGIOGRAPHY HEAD AND NECK CT HEAD WITHOUT CONTRAST TECHNIQUE: Multidetector CT imaging of the head and neck was performed using the standard protocol during bolus administration of intravenous contrast. Multiplanar CT image reconstructions and MIPs were obtained to evaluate the vascular anatomy. Carotid stenosis measurements (when applicable) are obtained utilizing NASCET criteria, using the distal internal carotid diameter as the denominator. CONTRAST:  46mL OMNIPAQUE IOHEXOL 350 MG/ML SOLN COMPARISON:  None. FINDINGS: CT HEAD FINDINGS Brain: No acute intracranial hemorrhage, mass effect, or edema. Gray-white differentiation is preserved. Prominence of the ventricles and sulci reflects generalized parenchymal volume loss. Patchy hypoattenuation in the supratentorial white matter is nonspecific but probably reflects mild to moderate chronic microvascular ischemic changes. There is no extra-axial collection. Vascular: No hyperdense vessel. Skull: Unremarkable. Sinuses/Orbits: Minor mucosal thickening. Bilateral lens replacements. Other: Mastoid air cells are clear. ASPECTS ([place] Stroke Program Early CT Score) - Ganglionic level infarction (caudate, lentiform nuclei, internal capsule, insula, M1-M3 cortex): 7 - Supraganglionic infarction (M4-M6 cortex): 3 Total score (0-10 with 10 being  normal): 10 Review of the MIP images confirms the above findings CTA NECK FINDINGS Aortic arch: Mixed plaque along the visualized arch and patent great vessel origins. Right carotid system: Patent. No measurable stenosis at the ICA origin. Left carotid system: Patent. Trace calcified plaque at the ICA origin. No measurable stenosis. Vertebral arteries: Patent.  Left vertebral artery is dominant. Skeleton: Cervical spine degenerative changes. Other neck: Negative. Upper chest: No apical lung mass. Review of the MIP images confirms the above findings CTA HEAD FINDINGS Anterior circulation: Intracranial internal carotid arteries are patent with mild calcified plaque. Anterior cerebral arteries are patent with patent anterior communicating artery. Middle cerebral arteries are patent. Posterior circulation: Intracranial vertebral arteries, basilar artery, and posterior cerebral arteries are patent. Major cerebellar artery branch origins are patent. Venous sinuses: As permitted by contrast timing, patent. Review of the MIP images confirms the above findings IMPRESSION: There is no acute intracranial hemorrhage or evidence of acute infarction. ASPECT score is 10. No large vessel occlusion or hemodynamically significant stenosis. Initial results were provided  by telephone at the time of interpretation on 09/09/2020 at 10:52 am to provider [facility] , who verbally acknowledged these results. Electronically Signed   By: Guadlupe Spanish M.D.   On: 09/09/2020 11:13   DG Chest Portable 1 View  Result Date: 09/09/2020 CLINICAL DATA:  Syncope. EXAM: PORTABLE CHEST 1 VIEW COMPARISON:  None. FINDINGS: Midline trachea. Mild cardiomegaly. No right and no definite left pleural effusion. Breast tissue projects over the left costophrenic angle. Biapical pleural thickening. Possible peripheral right lower lung airspace disease. Left lung base not well evaluated. IMPRESSION: Possible peripheral right lower lobe airspace disease, for  which pneumonia cannot be excluded. Suboptimal evaluation of the left lung base and left pleural space due to overlying breast tissue. If feasible, recommend further evaluation with PA and lateral radiographs. Cardiomegaly without congestive failure. Electronically Signed   By: Jeronimo Greaves M.D.   On: 09/09/2020 10:27   ECHOCARDIOGRAM COMPLETE  Result Date: 09/09/2020    ECHOCARDIOGRAM REPORT   Patient Name:   MADAILEIN LONDO Date of Exam: 09/09/2020 Medical Rec #:  [id number]     Height:       65.5 in Accession #:    [id number]    Weight:       124.6 lb Date of Birth:  Oct 21, 1934    BSA:          1.627 m Patient Age:    85 years      BP:           150/78 mmHg Patient Gender: F             HR:           59 bpm. Exam Location:  Inpatient Procedure: 2D Echo, Cardiac Doppler and Color Doppler Indications:    Syncope  History:        Patient has no prior history of Echocardiogram examinations.                 Signs/Symptoms:Alzheimer's and Syncope.  Sonographer:    Lavenia Atlas Referring Phys: [id number] Kara Mead IMPRESSIONS  1. Left ventricular ejection fraction, by estimation, is 60 to 65%. The left ventricle has normal function. The left ventricle has no regional wall motion abnormalities. Left ventricular diastolic parameters are consistent with Grade I diastolic dysfunction (impaired relaxation).  2. Right ventricular systolic function is normal. The right ventricular size is normal. There is normal pulmonary artery systolic pressure. The estimated right ventricular systolic pressure is 35.3 mmHg.  3. The mitral valve is normal in structure. Mild mitral valve regurgitation. No evidence of mitral stenosis.  4. The aortic valve is normal in structure. Aortic valve regurgitation is mild. No aortic stenosis is present.  5. The inferior vena cava is normal in size with greater than 50% respiratory variability, suggesting right atrial pressure of 3 mmHg. FINDINGS  Left Ventricle: Left ventricular ejection  fraction, by estimation, is 60 to 65%. The left ventricle has normal function. The left ventricle has no regional wall motion abnormalities. The left ventricular internal cavity size was normal in size. There is  no left ventricular hypertrophy. Left ventricular diastolic parameters are consistent with Grade I diastolic dysfunction (impaired relaxation). Right Ventricle: The right ventricular size is normal. No increase in right ventricular wall thickness. Right ventricular systolic function is normal. There is normal pulmonary artery systolic pressure. The tricuspid regurgitant velocity is 2.84 m/s, and  with an assumed right atrial pressure of 3 mmHg, the estimated right ventricular systolic pressure is  35.3 mmHg. Left Atrium: Left atrial size was normal in size. Right Atrium: Right atrial size was normal in size. Pericardium: There is no evidence of pericardial effusion. Mitral Valve: The mitral valve is normal in structure. Mild mitral valve regurgitation. No evidence of mitral valve stenosis. Tricuspid Valve: The tricuspid valve is normal in structure. Tricuspid valve regurgitation is mild . No evidence of tricuspid stenosis. Aortic Valve: The aortic valve is normal in structure. Aortic valve regurgitation is mild. Aortic regurgitation PHT measures 812 msec. No aortic stenosis is present. Pulmonic Valve: The pulmonic valve was normal in structure. Pulmonic valve regurgitation is not visualized. No evidence of pulmonic stenosis. Aorta: The aortic root is normal in size and structure. Venous: The inferior vena cava is normal in size with greater than 50% respiratory variability, suggesting right atrial pressure of 3 mmHg. IAS/Shunts: No atrial level shunt detected by color flow Doppler.  LEFT VENTRICLE PLAX 2D LVIDd:         3.90 cm  Diastology LVIDs:         3.00 cm  LV e' medial:    5.11 cm/s LV PW:         1.00 cm  LV E/e' medial:  9.6 LV IVS:        1.00 cm  LV e' lateral:   6.42 cm/s LVOT diam:     2.00 cm   LV E/e' lateral: 7.7 LV SV:         74 LV SV Index:   45 LVOT Area:     3.14 cm  RIGHT VENTRICLE RV Basal diam:  2.40 cm RV S prime:     12.10 cm/s TAPSE (M-mode): 2.7 cm LEFT ATRIUM             Index       RIGHT ATRIUM           Index LA diam:        3.00 cm 1.84 cm/m  RA Area:     14.30 cm LA Vol (A2C):   26.9 ml 16.53 ml/m RA Volume:   33.90 ml  20.84 ml/m LA Vol (A4C):   26.2 ml 16.10 ml/m LA Biplane Vol: 28.8 ml 17.70 ml/m  AORTIC VALVE LVOT Vmax:   96.60 cm/s LVOT Vmean:  63.600 cm/s LVOT VTI:    0.234 m AI PHT:      812 msec  AORTA Ao Root diam: 2.90 cm MITRAL VALVE               TRICUSPID VALVE MV Area (PHT): 2.29 cm    TR Peak grad:   32.3 mmHg MV Decel Time: 331 msec    TR Vmax:        284.00 cm/s MV E velocity: 49.30 cm/s MV A velocity: 93.80 cm/s  SHUNTS MV E/A ratio:  0.53        Systemic VTI:  0.23 m                            Systemic Diam: 2.00 cm Donato Schultz MD Electronically signed by Donato Schultz MD Signature Date/Time: 09/09/2020/2:58:12 PM    Final    CT HEAD CODE STROKE WO CONTRAST  Result Date: 09/09/2020 CLINICAL DATA:  Code stroke EXAM: CT ANGIOGRAPHY HEAD AND NECK CT HEAD WITHOUT CONTRAST TECHNIQUE: Multidetector CT imaging of the head and neck was performed using the standard protocol during bolus administration of intravenous contrast. Multiplanar CT image reconstructions and  MIPs were obtained to evaluate the vascular anatomy. Carotid stenosis measurements (when applicable) are obtained utilizing NASCET criteria, using the distal internal carotid diameter as the denominator. CONTRAST:  50mL OMNIPAQUE IOHEXOL 350 MG/ML SOLN COMPARISON:  None. FINDINGS: CT HEAD FINDINGS Brain: No acute intracranial hemorrhage, mass effect, or edema. Gray-white differentiation is preserved. Prominence of the ventricles and sulci reflects generalized parenchymal volume loss. Patchy hypoattenuation in the supratentorial white matter is nonspecific but probably reflects mild to moderate chronic  microvascular ischemic changes. There is no extra-axial collection. Vascular: No hyperdense vessel. Skull: Unremarkable. Sinuses/Orbits: Minor mucosal thickening. Bilateral lens replacements. Other: Mastoid air cells are clear. ASPECTS ([place] Stroke Program Early CT Score) - Ganglionic level infarction (caudate, lentiform nuclei, internal capsule, insula, M1-M3 cortex): 7 - Supraganglionic infarction (M4-M6 cortex): 3 Total score (0-10 with 10 being normal): 10 Review of the MIP images confirms the above findings CTA NECK FINDINGS Aortic arch: Mixed plaque along the visualized arch and patent great vessel origins. Right carotid system: Patent. No measurable stenosis at the ICA origin. Left carotid system: Patent. Trace calcified plaque at the ICA origin. No measurable stenosis. Vertebral arteries: Patent.  Left vertebral artery is dominant. Skeleton: Cervical spine degenerative changes. Other neck: Negative. Upper chest: No apical lung mass. Review of the MIP images confirms the above findings CTA HEAD FINDINGS Anterior circulation: Intracranial internal carotid arteries are patent with mild calcified plaque. Anterior cerebral arteries are patent with patent anterior communicating artery. Middle cerebral arteries are patent. Posterior circulation: Intracranial vertebral arteries, basilar artery, and posterior cerebral arteries are patent. Major cerebellar artery branch origins are patent. Venous sinuses: As permitted by contrast timing, patent. Review of the MIP images confirms the above findings IMPRESSION: There is no acute intracranial hemorrhage or evidence of acute infarction. ASPECT score is 10. No large vessel occlusion or hemodynamically significant stenosis. Initial results were provided by telephone at the time of interpretation on 09/09/2020 at 10:52 am to provider [facility] , who verbally acknowledged these results. Electronically Signed   By: Guadlupe Spanish M.D.   On: 09/09/2020 11:13    EKG:  Independently reviewed.  NSR with rate 66; prolonged QTc 501; nonspecific ST changes with no evidence of acute ischemia   Labs on Admission: I have personally reviewed the available labs and imaging studies at the time of the admission.  Pertinent labs:   BUN 13/Creatinine 1.07/GFR 52 INR 1.1 Valproic acid 18 HS troponin 7 UA WNL UDS + BZD   Assessment/Plan Principal Problem:   Syncope and collapse Active Problems:   Late onset Alzheimer's disease without behavioral disturbance (HCC)   Syncope in the setting of progressive dementia -Patient with worsening/progressive recent dementia -She was moved into a memory care facility yesterday and this was quite stressful for the patient  -She had an event today where she collapsed against the wall and has basically been catatonic since -While this may be associated with seizure, stroke, or other acute organic neurologic concern, there is also the possibility that this was a conversion-type reaction associated with the transition out of her home and into a facility -Evaluation thus far is unremarkable -Will observe overnight on telemetry in the hospital. -Will complete TIA evaluation with MRI, echo -EEG is pending -Orthostatic vital signs now and in AM -Troponin negative -Neuro checks  -start ASA -PT/OT eval and treat -ST swallow and cognitive evaluations requested -[facility] team consult, as the family is no longer sure they can send her back to memory care -Palliative care consult -  Depakote previously started at 125 mg daily and dose was increased to 250 mg yesterday - low level, resume at 125 mg dose tomorrow -Continue Lexapro, Namenda tomorrow -Hold Xanax and Trazodone for now; despite these being long-term medications, they could contribute to AMS     Note: This patient has been tested and is negative for the novel coronavirus COVID-19. The patient has been fully vaccinated against COVID-19.   DVT prophylaxis: Lovenox  Code  Status:  DNR - confirmed with family Family Communication: Daughter was present throughout evaluation Disposition Plan:  The patient is from: ALF  Anticipated d/c is to: to be determined  Anticipated d/c date will depend on clinical response to treatment, but may be as early as tomorrow with good response  Patient is currently: acutely ill Consults called: Palliative care, Neurology; PT/OT/ST/TOC team Admission status: It is my clinical opinion that referral for OBSERVATION is reasonable and necessary in this patient based on the above information provided. The aforementioned taken together are felt to place the patient at high risk for further clinical deterioration. However it is anticipated that the patient may be medically stable for discharge from the hospital within 24 to 48 hours.     Jonah BlueJennifer Isiaah Cuervo MD [facility]   How to contact the [facility] Attending or Consulting provider 7A - 7P or covering provider during after hours 7P -7A, for this patient?  1. Check the [facility] in [facility] and look for a) attending/consulting [facility] provider listed and b) the [facility] team listed 2. Log into [email or web address] and use 's universal password to access. If you do not have the password, please contact the hospital operator. 3. Locate the [facility] provider you are looking for under [facility] and page to a number that you can be directly reached. 4. If you still have difficulty reaching the provider, please page the [facility]DOC (Director on Call) for the Hospitalists listed on amion for assistance.   09/09/2020, 3:24 PM

## 2020-09-10 DIAGNOSIS — G301 Alzheimer's disease with late onset: Secondary | ICD-10-CM | POA: Diagnosis not present

## 2020-09-10 DIAGNOSIS — F0280 Dementia in other diseases classified elsewhere without behavioral disturbance: Secondary | ICD-10-CM | POA: Diagnosis not present

## 2020-09-10 DIAGNOSIS — Z79899 Other long term (current) drug therapy: Secondary | ICD-10-CM | POA: Diagnosis not present

## 2020-09-10 DIAGNOSIS — R2681 Unsteadiness on feet: Secondary | ICD-10-CM | POA: Diagnosis not present

## 2020-09-10 DIAGNOSIS — R4182 Altered mental status, unspecified: Secondary | ICD-10-CM | POA: Diagnosis not present

## 2020-09-10 LAB — LIPID PANEL
Cholesterol: 149 mg/dL (ref 0–200)
HDL: 54 mg/dL (ref >40)
LDL Cholesterol: 76 mg/dL (ref 0–99)
Total CHOL/HDL Ratio: 2.8 RATIO
Triglycerides: 96 mg/dL (ref <150)
VLDL: 19 mg/dL (ref 0–40)

## 2020-09-10 LAB — AMMONIA: Ammonia: 46 umol/L — ABNORMAL HIGH (ref 9–35)

## 2020-09-10 LAB — BASIC METABOLIC PANEL
Anion gap: 8 (ref 5–15)
BUN: 10 mg/dL (ref 8–23)
CO2: 23 mmol/L (ref 22–32)
Calcium: 8.2 mg/dL — ABNORMAL LOW (ref 8.9–10.3)
Chloride: 110 mmol/L (ref 98–111)
Creatinine, Ser: 0.73 mg/dL (ref 0.44–1.00)
GFR, Estimated: >60 mL/min (ref >60)
Glucose, Bld: 84 mg/dL (ref 70–99)
Potassium: 3.6 mmol/L (ref 3.5–5.1)
Sodium: 141 mmol/L (ref 135–145)

## 2020-09-10 LAB — CBC
HCT: 33.6 % — ABNORMAL LOW (ref 36.0–46.0)
Hemoglobin: 11.7 g/dL — ABNORMAL LOW (ref 12.0–15.0)
MCH: 33.1 pg (ref 26.0–34.0)
MCHC: 34.8 g/dL (ref 30.0–36.0)
MCV: 95.2 fL (ref 80.0–100.0)
Platelets: 230 10*3/uL (ref 150–400)
RBC: 3.53 MIL/uL — ABNORMAL LOW (ref 3.87–5.11)
RDW: 13.2 % (ref 11.5–15.5)
WBC: 6.7 10*3/uL (ref 4.0–10.5)
nRBC: 0 % (ref 0.0–0.2)

## 2020-09-10 MED ORDER — ALPRAZOLAM 0.25 MG PO TABS: 0.2500 mg | ORAL_TABLET | Freq: Two times a day (BID) | ORAL | 0 refills | Status: DC

## 2020-09-10 MED ORDER — ESCITALOPRAM OXALATE 20 MG PO TABS
30.0000 mg | ORAL_TABLET | Freq: Every day | ORAL | Status: DC
  Filled 2020-09-10: qty 2

## 2020-09-10 MED ORDER — ALPRAZOLAM 0.25 MG PO TABS: 0.2500 mg | ORAL_TABLET | Freq: Two times a day (BID) | ORAL | 0 refills | Status: AC | PRN

## 2020-09-10 MED ORDER — DIVALPROEX SODIUM 125 MG PO DR TAB: 125.0000 mg | DELAYED_RELEASE_TABLET | Freq: Every day | ORAL | 0 refills | Status: AC

## 2020-09-10 MED ORDER — TRAZODONE HCL 50 MG PO TABS: 50.0000 mg | ORAL_TABLET | Freq: Every day | ORAL | Status: DC

## 2020-09-10 MED ORDER — MELATONIN 5 MG PO CAPS: 5.0000 mg | ORAL_CAPSULE | Freq: Every evening | ORAL | 1 refills | Status: AC

## 2020-09-10 MED ORDER — DIVALPROEX SODIUM 125 MG PO DR TAB: 125.0000 mg | DELAYED_RELEASE_TABLET | Freq: Every day | ORAL | 0 refills | Status: DC

## 2020-09-10 MED ORDER — TRAZODONE HCL 50 MG PO TABS: 50.0000 mg | ORAL_TABLET | Freq: Every day | ORAL | 1 refills | Status: AC

## 2020-09-10 MED ORDER — ESCITALOPRAM OXALATE 20 MG PO TABS: 20.0000 mg | ORAL_TABLET | Freq: Every day | ORAL | 1 refills | Status: AC

## 2020-09-10 NOTE — Discharge Summary (Signed)
Physician Discharge Summary  ADAMARI FREDE HBZ:[id number] DOB: 1935-05-28 DOA: 09/09/2020  PCP: Mechele Claude, MD  Admit date: 09/09/2020 Discharge date: 09/10/2020  Admitted From: Memory care unit Disposition: Memory care unit  Recommendations for Outpatient Follow-up:  1. Follow ups as below. 2. Please obtain CBC/BMP/Mag at follow up 3. Please follow up on the following pending results: None  Home Health: PT/OT Equipment/Devices: None  Discharge Condition: Stable CODE STATUS: DNR/DNI   Follow-up Information    Mechele Claude, MD. Schedule an appointment as soon as possible for a visit in 1 week(s).   Specialty: Family Medicine Contact information: [address] [place] [address] [phone]        Van Clines, MD. Schedule an appointment as soon as possible for a visit in 3 week(s).   Specialty: Neurology Contact information: [address] [place] [address] [phone]               HPI: Per Dr. Ophelia Charter 86 y.o. female with medical history significant of dementia presenting with syncope.  Her daughter was with her all day yesterday.  It was a very stressful day but she ate fine and walked around.  She had lots of questions and anxiety.  After dinner. She stumbled a bit and her hands looked somewhat shaky and then it stopped.  She slept all night and got up this AM for break\fast.  After breakfast they noticed her with a wide gait.  She didn't fall but leaned against the wall and "went down."  She was unconscious but still breathing.  She appeared to have UL weakness.  Her neurologist had prescribed 250 mg Depakote but they were not giving that dose at home - it was increased to this dose yesterday.  She has had some hallucinations but no aggression.  Her brother died a few months ago and she had significant progression since.   ED Course:  Syncope/TIA evaluation.  Progressive dementia, placed yesterday.  Episode today, witnessed episode of  LOC with leaning against the wall.  Hypotensive with EMS.  Somnolent now, different from baseline.  Called code stroke, appears to be negative.  MRI recommended by neurology.  Orthostatics normal.  Likely needs obs, echo.  Depakote increased yesterday, on Xanax prn for years.  Hospital Course: Patient remained unstable.  Work-up including basic labs, CT head, MRI brain, EEG, EKG, troponin, urinalysis, TSH, TTE and telemetry without significant finding.  Fall, possible syncope and encephalopathy felt to be due to polypharmacy in patient with history of severe dementia.  Patient symptoms improved.  Neurology recommended reducing home Xanax to 0.25 mg as needed, Lexapro to 20 mg daily, trazodone to 50 mg nightly, continuing Depakote at 125 mg daily and adding melatonin 5 mg nightly, and cleared patient for discharge.  Therapy recommended home health PT/OT.  Plan discussed with patient's daughter at bedside.  Patient is discharged back to memory care unit.  Discharge Diagnoses:  Fall out memory care unit Acute encephalopathy likely toxic from medications Unsteady gait Anxiety -Plan as above.  Severe dementia without behavioral disturbance -Reorientation and delirium precautions -Continue home medications with adjustment as above.   Body mass index is 20.23 kg/m.            Discharge Exam: Vitals:   09/10/20 0900 09/10/20 0935  BP: 108/67 109/64  Pulse:    Resp:    Temp:    SpO2:      GENERAL: No apparent distress.  Nontoxic. HEENT: MMM.  Vision and hearing grossly intact.  NECK: Supple.  No apparent JVD.  RESP: On RA.  No IWOB.  Fair aeration bilaterally. CVS:  RRR. Heart sounds normal.  ABD/GI/GU: Bowel sounds present. Soft. Non tender.  MSK/EXT:  Moves extremities. No apparent deformity. No edema.  SKIN: no apparent skin lesion or wound NEURO: Awake and alert.  Oriented to her first name but not last.  Follows some commands.  No apparent focal neuro deficit but limited exam  due to patient's inability to comprehend instruction. PSYCH: Calm. Normal affect.  Discharge Instructions  Discharge Instructions    Diet general   Complete by: As directed    Discharge instructions   Complete by: As directed    It has been a pleasure taking care of you!  You were hospitalized due to unsteady gait, unconsciousness and fall. After the test is we have done, we think this could be related to your medications. We have made adjustments to your medications. Please review your new medication list and the directions on your medications before you take them. Please follow-up with your primary care doctor in 1 to 2 weeks. Also recommend follow-up with your neurologist in 3 to 4 weeks or sooner if needed   Take care,   Increase activity slowly   Complete by: As directed      Allergies as of 09/10/2020      Reactions   Levaquin [levofloxacin In D5w] Rash      Medication List    TAKE these medications   ALPRAZolam 0.25 MG tablet Commonly known as: XANAX Take 1 tablet (0.25 mg total) by mouth 2 (two) times daily as needed for anxiety. What changed:   medication strength  how much to take  when to take this  reasons to take this   divalproex 125 MG DR tablet Commonly known as: DEPAKOTE Take 1 tablet (125 mg total) by mouth daily. What changed: how much to take   escitalopram 20 MG tablet Commonly known as: Lexapro Take 1 tablet (20 mg total) by mouth daily. What changed:   how much to take  how to take this  when to take this  additional instructions   Melatonin 5 MG Caps Take 1 capsule (5 mg total) by mouth at bedtime.   memantine 10 MG tablet Commonly known as: NAMENDA Take 1 tablet (10 mg total) by mouth 2 (two) times daily.   raloxifene 60 MG tablet Commonly known as: EVISTA Take 60 mg by mouth once daily What changed:   how much to take  how to take this  when to take this  additional instructions   traZODone 50 MG tablet Commonly  known as: DESYREL Take 1 tablet (50 mg total) by mouth at bedtime. What changed:   medication strength  how much to take   Vitamin D-3 125 MCG (5000 UT) Tabs Take 1 tablet by mouth daily. What changed: how much to take   Vitamin K2 100 MCG Tabs Take 100 mcg by mouth daily.       Consultations:  Neurology  Procedures/Studies:  2D Echo on 09/09/2020 1. Left ventricular ejection fraction, by estimation, is 60 to 65%. The  left ventricle has normal function. The left ventricle has no regional  wall motion abnormalities. Left ventricular diastolic parameters are  consistent with Grade I diastolic  dysfunction (impaired relaxation).  2. Right ventricular systolic function is normal. The right ventricular  size is normal. There is normal pulmonary artery systolic pressure. The  estimated right  ventricular systolic pressure is 35.3 mmHg.  3. The mitral valve is normal in structure. Mild mitral valve  regurgitation. No evidence of mitral stenosis.  4. The aortic valve is normal in structure. Aortic valve regurgitation is  mild. No aortic stenosis is present.  5. The inferior vena cava is normal in size with greater than 50%  respiratory variability, suggesting right atrial pressure of 3 mmHg.    CT Code Stroke CTA Head W/WO contrast  Result Date: 09/09/2020 CLINICAL DATA:  Code stroke EXAM: CT ANGIOGRAPHY HEAD AND NECK CT HEAD WITHOUT CONTRAST TECHNIQUE: Multidetector CT imaging of the head and neck was performed using the standard protocol during bolus administration of intravenous contrast. Multiplanar CT image reconstructions and MIPs were obtained to evaluate the vascular anatomy. Carotid stenosis measurements (when applicable) are obtained utilizing NASCET criteria, using the distal internal carotid diameter as the denominator. CONTRAST:  50mL OMNIPAQUE IOHEXOL 350 MG/ML SOLN COMPARISON:  None. FINDINGS: CT HEAD FINDINGS Brain: No acute intracranial hemorrhage, mass effect,  or edema. Gray-white differentiation is preserved. Prominence of the ventricles and sulci reflects generalized parenchymal volume loss. Patchy hypoattenuation in the supratentorial white matter is nonspecific but probably reflects mild to moderate chronic microvascular ischemic changes. There is no extra-axial collection. Vascular: No hyperdense vessel. Skull: Unremarkable. Sinuses/Orbits: Minor mucosal thickening. Bilateral lens replacements. Other: Mastoid air cells are clear. ASPECTS (Alberta Stroke Program Early CT Score) - Ganglionic level infarction (caudate, lentiform nuclei, internal capsule, insula, M1-M3 cortex): 7 - Supraganglionic infarction (M4-M6 cortex): 3 Total score (0-10 with 10 being normal): 10 Review of the MIP images confirms the above findings CTA NECK FINDINGS Aortic arch: Mixed plaque along the visualized arch and patent great vessel origins. Right carotid system: Patent. No measurable stenosis at the ICA origin. Left carotid system: Patent. Trace calcified plaque at the ICA origin. No measurable stenosis. Vertebral arteries: Patent.  Left vertebral artery is dominant. Skeleton: Cervical spine degenerative changes. Other neck: Negative. Upper chest: No apical lung mass. Review of the MIP images confirms the above findings CTA HEAD FINDINGS Anterior circulation: Intracranial internal carotid arteries are patent with mild calcified plaque. Anterior cerebral arteries are patent with patent anterior communicating artery. Middle cerebral arteries are patent. Posterior circulation: Intracranial vertebral arteries, basilar artery, and posterior cerebral arteries are patent. Major cerebellar artery branch origins are patent. Venous sinuses: As permitted by contrast timing, patent. Review of the MIP images confirms the above findings IMPRESSION: There is no acute intracranial hemorrhage or evidence of acute infarction. ASPECT score is 10. No large vessel occlusion or hemodynamically significant  stenosis. Initial results were provided by telephone at the time of interpretation on 09/09/2020 at 10:52 am to provider [facility] , who verbally acknowledged these results. Electronically Signed   By: Guadlupe Spanish M.D.   On: 09/09/2020 11:13   CT Code Stroke CTA Neck W/WO contrast  Result Date: 09/09/2020 CLINICAL DATA:  Code stroke EXAM: CT ANGIOGRAPHY HEAD AND NECK CT HEAD WITHOUT CONTRAST TECHNIQUE: Multidetector CT imaging of the head and neck was performed using the standard protocol during bolus administration of intravenous contrast. Multiplanar CT image reconstructions and MIPs were obtained to evaluate the vascular anatomy. Carotid stenosis measurements (when applicable) are obtained utilizing NASCET criteria, using the distal internal carotid diameter as the denominator. CONTRAST:  50mL OMNIPAQUE IOHEXOL 350 MG/ML SOLN COMPARISON:  None. FINDINGS: CT HEAD FINDINGS Brain: No acute intracranial hemorrhage, mass effect, or edema. Gray-white differentiation is preserved. Prominence of the ventricles and sulci reflects generalized parenchymal  volume loss. Patchy hypoattenuation in the supratentorial white matter is nonspecific but probably reflects mild to moderate chronic microvascular ischemic changes. There is no extra-axial collection. Vascular: No hyperdense vessel. Skull: Unremarkable. Sinuses/Orbits: Minor mucosal thickening. Bilateral lens replacements. Other: Mastoid air cells are clear. ASPECTS (Alberta Stroke Program Early CT Score) - Ganglionic level infarction (caudate, lentiform nuclei, internal capsule, insula, M1-M3 cortex): 7 - Supraganglionic infarction (M4-M6 cortex): 3 Total score (0-10 with 10 being normal): 10 Review of the MIP images confirms the above findings CTA NECK FINDINGS Aortic arch: Mixed plaque along the visualized arch and patent great vessel origins. Right carotid system: Patent. No measurable stenosis at the ICA origin. Left carotid system: Patent. Trace calcified  plaque at the ICA origin. No measurable stenosis. Vertebral arteries: Patent.  Left vertebral artery is dominant. Skeleton: Cervical spine degenerative changes. Other neck: Negative. Upper chest: No apical lung mass. Review of the MIP images confirms the above findings CTA HEAD FINDINGS Anterior circulation: Intracranial internal carotid arteries are patent with mild calcified plaque. Anterior cerebral arteries are patent with patent anterior communicating artery. Middle cerebral arteries are patent. Posterior circulation: Intracranial vertebral arteries, basilar artery, and posterior cerebral arteries are patent. Major cerebellar artery branch origins are patent. Venous sinuses: As permitted by contrast timing, patent. Review of the MIP images confirms the above findings IMPRESSION: There is no acute intracranial hemorrhage or evidence of acute infarction. ASPECT score is 10. No large vessel occlusion or hemodynamically significant stenosis. Initial results were provided by telephone at the time of interpretation on 09/09/2020 at 10:52 am to provider [facility] , who verbally acknowledged these results. Electronically Signed   By: Guadlupe Spanish M.D.   On: 09/09/2020 11:13   MR BRAIN WO CONTRAST  Result Date: 09/09/2020 CLINICAL DATA:  Neuro deficit, acute, stroke suspected Syncope, simple, abnormal neuro exam EXAM: MRI HEAD WITHOUT CONTRAST TECHNIQUE: Multiplanar, multiecho pulse sequences of the brain and surrounding structures were obtained without intravenous contrast. COMPARISON:  09/09/2020 and prior. FINDINGS: Brain: No diffusion-weighted signal abnormality. No intracranial hemorrhage. No midline shift, ventriculomegaly or extra-axial fluid collection. No mass lesion. Diffuse cerebral atrophy with ex vacuo dilatation. Mild chronic microvascular ischemic changes. Vascular: Please see recent CTA. Skull and upper cervical spine: Normal marrow signal. Sinuses/Orbits: Sequela of bilateral lens replacement.  Clear paranasal sinuses. Trace left mastoid free fluid. Other: None. IMPRESSION: No acute intracranial process. Age-related cerebral atrophy and chronic microvascular ischemic changes. Electronically Signed   By: Stana Bunting M.D.   On: 09/09/2020 18:44   DG Chest Portable 1 View  Result Date: 09/09/2020 CLINICAL DATA:  Syncope. EXAM: PORTABLE CHEST 1 VIEW COMPARISON:  None. FINDINGS: Midline trachea. Mild cardiomegaly. No right and no definite left pleural effusion. Breast tissue projects over the left costophrenic angle. Biapical pleural thickening. Possible peripheral right lower lung airspace disease. Left lung base not well evaluated. IMPRESSION: Possible peripheral right lower lobe airspace disease, for which pneumonia cannot be excluded. Suboptimal evaluation of the left lung base and left pleural space due to overlying breast tissue. If feasible, recommend further evaluation with PA and lateral radiographs. Cardiomegaly without congestive failure. Electronically Signed   By: Jeronimo Greaves M.D.   On: 09/09/2020 10:27   EEG adult  Result Date: 09/09/2020 Charlsie Quest, MD     09/09/2020  6:22 PM Patient Name: Jaclyn Robertson MRN: [id number] Epilepsy Attending: Charlsie Quest Referring Physician/Provider: Dr Jonah Blue Date: 09/09/2020 Duration: 26.25 mins Patient history: 86 year old female with history of Alzheimer's dementia  with rapid memory decline and with mood disturbance moved to memory care unit yesterday with doubled dose of Depakote (250 mg daily) presents following syncope/near syncope event with initial systolic blood pressure of 70 for EMS. Varying symptoms present for 1-2 weeks with stumbling gait, tremor noted with walking and "drunk" feeling ambulation noted last night and a near syncopal event this morning with associated hypotension. EEG to evaluate for seizure. Level of alertness: Awake, asleep AEDs during EEG study: VPA Technical aspects: This EEG study was done with  scalp electrodes positioned according to the 10-20 International system of electrode placement. Electrical activity was acquired at a sampling rate of  and reviewed with a high frequency filter of  and a low frequency filter of . EEG data were recorded continuously and digitally stored. Description: The posterior dominant rhythm consists of 9 Hz activity of moderate voltage (25-35 uV) seen predominantly in posterior head regions, symmetric and reactive to eye opening and eye closing. Sleep was characterized by vertex waves, sleep spindles (12 to 14 Hz), maximal frontocentral region. EEG showed intermittent generalized 3-5Hz  theta-delta slowing. Hyperventilation and photic stimulation were not performed.   ABNORMALITY -Intermittent slow, generalized IMPRESSION: This study is suggestive of mild diffuse encephalopathy, nonspecific etiology. No seizures or epileptiform discharges were seen throughout the recording. Charlsie Quest   ECHOCARDIOGRAM COMPLETE  Result Date: 09/09/2020    ECHOCARDIOGRAM REPORT   Patient Name:   CRESCENT GOTHAM Date of Exam: 09/09/2020 Medical Rec #:  [id number]     Height:       65.5 in Accession #:    [id number]    Weight:       124.6 lb Date of Birth:  1934/12/03    BSA:          1.627 m Patient Age:    85 years      BP:           150/78 mmHg Patient Gender: F             HR:           59 bpm. Exam Location:  Inpatient Procedure: 2D Echo, Cardiac Doppler and Color Doppler Indications:    Syncope  History:        Patient has no prior history of Echocardiogram examinations.                 Signs/Symptoms:Alzheimer's and Syncope.  Sonographer:    Lavenia Atlas Referring Phys: [id number] Kara Mead IMPRESSIONS  1. Left ventricular ejection fraction, by estimation, is 60 to 65%. The left ventricle has normal function. The left ventricle has no regional wall motion abnormalities. Left ventricular diastolic parameters are consistent with Grade I diastolic dysfunction  (impaired relaxation).  2. Right ventricular systolic function is normal. The right ventricular size is normal. There is normal pulmonary artery systolic pressure. The estimated right ventricular systolic pressure is 35.3 mmHg.  3. The mitral valve is normal in structure. Mild mitral valve regurgitation. No evidence of mitral stenosis.  4. The aortic valve is normal in structure. Aortic valve regurgitation is mild. No aortic stenosis is present.  5. The inferior vena cava is normal in size with greater than 50% respiratory variability, suggesting right atrial pressure of 3 mmHg. FINDINGS  Left Ventricle: Left ventricular ejection fraction, by estimation, is 60 to 65%. The left ventricle has normal function. The left ventricle has no regional wall motion abnormalities. The left ventricular internal cavity size was normal in size. There is  no left ventricular hypertrophy. Left ventricular diastolic parameters are consistent with Grade I diastolic dysfunction (impaired relaxation). Right Ventricle: The right ventricular size is normal. No increase in right ventricular wall thickness. Right ventricular systolic function is normal. There is normal pulmonary artery systolic pressure. The tricuspid regurgitant velocity is 2.84 m/s, and  with an assumed right atrial pressure of 3 mmHg, the estimated right ventricular systolic pressure is 35.3 mmHg. Left Atrium: Left atrial size was normal in size. Right Atrium: Right atrial size was normal in size. Pericardium: There is no evidence of pericardial effusion. Mitral Valve: The mitral valve is normal in structure. Mild mitral valve regurgitation. No evidence of mitral valve stenosis. Tricuspid Valve: The tricuspid valve is normal in structure. Tricuspid valve regurgitation is mild . No evidence of tricuspid stenosis. Aortic Valve: The aortic valve is normal in structure. Aortic valve regurgitation is mild. Aortic regurgitation PHT measures 812 msec. No aortic stenosis is  present. Pulmonic Valve: The pulmonic valve was normal in structure. Pulmonic valve regurgitation is not visualized. No evidence of pulmonic stenosis. Aorta: The aortic root is normal in size and structure. Venous: The inferior vena cava is normal in size with greater than 50% respiratory variability, suggesting right atrial pressure of 3 mmHg. IAS/Shunts: No atrial level shunt detected by color flow Doppler.  LEFT VENTRICLE PLAX 2D LVIDd:         3.90 cm  Diastology LVIDs:         3.00 cm  LV e' medial:    5.11 cm/s LV PW:         1.00 cm  LV E/e' medial:  9.6 LV IVS:        1.00 cm  LV e' lateral:   6.42 cm/s LVOT diam:     2.00 cm  LV E/e' lateral: 7.7 LV SV:         74 LV SV Index:   45 LVOT Area:     3.14 cm  RIGHT VENTRICLE RV Basal diam:  2.40 cm RV S prime:     12.10 cm/s TAPSE (M-mode): 2.7 cm LEFT ATRIUM             Index       RIGHT ATRIUM           Index LA diam:        3.00 cm 1.84 cm/m  RA Area:     14.30 cm LA Vol (A2C):   26.9 ml 16.53 ml/m RA Volume:   33.90 ml  20.84 ml/m LA Vol (A4C):   26.2 ml 16.10 ml/m LA Biplane Vol: 28.8 ml 17.70 ml/m  AORTIC VALVE LVOT Vmax:   96.60 cm/s LVOT Vmean:  63.600 cm/s LVOT VTI:    0.234 m AI PHT:      812 msec  AORTA Ao Root diam: 2.90 cm MITRAL VALVE               TRICUSPID VALVE MV Area (PHT): 2.29 cm    TR Peak grad:   32.3 mmHg MV Decel Time: 331 msec    TR Vmax:        284.00 cm/s MV E velocity: 49.30 cm/s MV A velocity: 93.80 cm/s  SHUNTS MV E/A ratio:  0.53        Systemic VTI:  0.23 m                            Systemic Diam: 2.00 cm Loraine Leriche  Skains MD Electronically signed by Donato Schultz MD Signature Date/Time: 09/09/2020/2:58:12 PM    Final    CT HEAD CODE STROKE WO CONTRAST  Result Date: 09/09/2020 CLINICAL DATA:  Code stroke EXAM: CT ANGIOGRAPHY HEAD AND NECK CT HEAD WITHOUT CONTRAST TECHNIQUE: Multidetector CT imaging of the head and neck was performed using the standard protocol during bolus administration of intravenous contrast. Multiplanar  CT image reconstructions and MIPs were obtained to evaluate the vascular anatomy. Carotid stenosis measurements (when applicable) are obtained utilizing NASCET criteria, using the distal internal carotid diameter as the denominator. CONTRAST:  50mL OMNIPAQUE IOHEXOL 350 MG/ML SOLN COMPARISON:  None. FINDINGS: CT HEAD FINDINGS Brain: No acute intracranial hemorrhage, mass effect, or edema. Gray-white differentiation is preserved. Prominence of the ventricles and sulci reflects generalized parenchymal volume loss. Patchy hypoattenuation in the supratentorial white matter is nonspecific but probably reflects mild to moderate chronic microvascular ischemic changes. There is no extra-axial collection. Vascular: No hyperdense vessel. Skull: Unremarkable. Sinuses/Orbits: Minor mucosal thickening. Bilateral lens replacements. Other: Mastoid air cells are clear. ASPECTS (Alberta Stroke Program Early CT Score) - Ganglionic level infarction (caudate, lentiform nuclei, internal capsule, insula, M1-M3 cortex): 7 - Supraganglionic infarction (M4-M6 cortex): 3 Total score (0-10 with 10 being normal): 10 Review of the MIP images confirms the above findings CTA NECK FINDINGS Aortic arch: Mixed plaque along the visualized arch and patent great vessel origins. Right carotid system: Patent. No measurable stenosis at the ICA origin. Left carotid system: Patent. Trace calcified plaque at the ICA origin. No measurable stenosis. Vertebral arteries: Patent.  Left vertebral artery is dominant. Skeleton: Cervical spine degenerative changes. Other neck: Negative. Upper chest: No apical lung mass. Review of the MIP images confirms the above findings CTA HEAD FINDINGS Anterior circulation: Intracranial internal carotid arteries are patent with mild calcified plaque. Anterior cerebral arteries are patent with patent anterior communicating artery. Middle cerebral arteries are patent. Posterior circulation: Intracranial vertebral arteries, basilar  artery, and posterior cerebral arteries are patent. Major cerebellar artery branch origins are patent. Venous sinuses: As permitted by contrast timing, patent. Review of the MIP images confirms the above findings IMPRESSION: There is no acute intracranial hemorrhage or evidence of acute infarction. ASPECT score is 10. No large vessel occlusion or hemodynamically significant stenosis. Initial results were provided by telephone at the time of interpretation on 09/09/2020 at 10:52 am to provider [facility] , who verbally acknowledged these results. Electronically Signed   By: Guadlupe Spanish M.D.   On: 09/09/2020 11:13       The results of significant diagnostics from this hospitalization (including imaging, microbiology, ancillary and laboratory) are listed below for reference.     Microbiology: Recent Results (from the past 240 hour(s))  Resp Panel by RT-PCR (Flu A&B, Covid) Nasopharyngeal Swab     Status: None   Collection Time: 09/09/20 10:10 AM   Specimen: Nasopharyngeal Swab; Nasopharyngeal(NP) swabs in vial transport medium  Result Value Ref Range Status   SARS Coronavirus 2 by RT PCR NEGATIVE NEGATIVE Final    Comment: (NOTE) SARS-CoV-2 target nucleic acids are NOT DETECTED.  The SARS-CoV-2 RNA is generally detectable in upper respiratory specimens during the acute phase of infection. The lowest concentration of SARS-CoV-2 viral copies this assay can detect is 138 copies/mL. A negative result does not preclude SARS-Cov-2 infection and should not be used as the sole basis for treatment or other patient management decisions. A negative result may occur with  improper specimen collection/handling, submission of specimen other than nasopharyngeal swab,  presence of viral mutation(s) within the areas targeted by this assay, and inadequate number of viral copies(<138 copies/mL). A negative result must be combined with clinical observations, patient history, and  epidemiological information. The expected result is Negative.  Fact Sheet for Patients:  BloggerCourse.com  Fact Sheet for Healthcare Providers:  SeriousBroker.it  This test is no t yet approved or cleared by the [place] FDA and  has been authorized for detection and/or diagnosis of SARS-CoV-2 by FDA under an Emergency Use Authorization (EUA). This EUA will remain  in effect (meaning this test can be used) for the duration of the COVID-19 declaration under Section 564(b)(1) of the Act, 21 U.S.C.section 360bbb-3(b)(1), unless the authorization is terminated  or revoked sooner.       Influenza A by PCR NEGATIVE NEGATIVE Final   Influenza B by PCR NEGATIVE NEGATIVE Final    Comment: (NOTE) The Xpert Xpress SARS-CoV-2/FLU/RSV plus assay is intended as an aid in the diagnosis of influenza from Nasopharyngeal swab specimens and should not be used as a sole basis for treatment. Nasal washings and aspirates are unacceptable for Xpert Xpress SARS-CoV-2/FLU/RSV testing.  Fact Sheet for Patients: BloggerCourse.com  Fact Sheet for Healthcare Providers: SeriousBroker.it  This test is not yet approved or cleared by the [place] FDA and has been authorized for detection and/or diagnosis of SARS-CoV-2 by FDA under an Emergency Use Authorization (EUA). This EUA will remain in effect (meaning this test can be used) for the duration of the COVID-19 declaration under Section 564(b)(1) of the Act, 21 U.S.C. section 360bbb-3(b)(1), unless the authorization is terminated or revoked.  Performed at [facility], [address], [place], [place] [address]      Labs:  CBC: Recent Labs  Lab 09/09/20 1000 09/09/20 1129 09/10/20 0349  WBC 9.2  --  6.7  NEUTROABS 6.4  --   --   HGB 11.3* 12.6 11.7*  HCT 34.2* 37.0 33.6*  MCV 98.6  --  95.2  PLT 230  --  230   BMP  &GFR Recent Labs  Lab 09/09/20 1000 09/09/20 1030 09/09/20 1129 09/10/20 0349  NA 144 143 143 141  K 3.9 4.2 4.3 3.6  CL 108 109 111 110  CO2 27 26  --  23  GLUCOSE 92 97 92 84  BUN 16 13 16 10   CREATININE 1.16* 1.07* 0.90 0.73  CALCIUM 8.1* 7.7*  --  8.2*   Estimated Creatinine Clearance: 47.1 mL/min (by C-G formula based on SCr of 0.73 mg/dL). Liver & Pancreas: Recent Labs  Lab 09/09/20 1030  AST 23  ALT 16  ALKPHOS 80  BILITOT 0.6  PROT 5.4*  ALBUMIN 3.0*   No results for input(s): LIPASE, AMYLASE in the last 168 hours. Recent Labs  Lab 09/09/20 1020 09/10/20 0837  AMMONIA 20 46*   Diabetic: Recent Labs    09/09/20 0949  HGBA1C 5.4   No results for input(s): GLUCAP in the last 168 hours. Cardiac Enzymes: No results for input(s): CKTOTAL, CKMB, CKMBINDEX, TROPONINI in the last 168 hours. No results for input(s): PROBNP in the last 8760 hours. Coagulation Profile: Recent Labs  Lab 09/09/20 1030  INR 1.1   Thyroid Function Tests: Recent Labs    09/09/20 1514  TSH 1.636   Lipid Profile: Recent Labs    09/10/20 0349  CHOL 149  HDL 54  LDLCALC 76  TRIG 96  CHOLHDL 2.8   Anemia Panel: No results for input(s): VITAMINB12, FOLATE, FERRITIN, TIBC, IRON, RETICCTPCT  in the last 72 hours. Urine analysis:    Component Value Date/Time   COLORURINE STRAW (A) 09/09/2020 1105   APPEARANCEUR CLEAR 09/09/2020 1105   APPEARANCEUR Clear 07/28/2020 1120   LABSPEC 1.009 09/09/2020 1105   PHURINE 7.0 09/09/2020 1105   GLUCOSEU NEGATIVE 09/09/2020 1105   GLUCOSEU NEGATIVE 02/13/2018 1417   HGBUR NEGATIVE 09/09/2020 1105   BILIRUBINUR NEGATIVE 09/09/2020 1105   BILIRUBINUR Negative 07/28/2020 1120   KETONESUR NEGATIVE 09/09/2020 1105   PROTEINUR NEGATIVE 09/09/2020 1105   UROBILINOGEN 0.2 02/13/2018 1417   NITRITE NEGATIVE 09/09/2020 1105   LEUKOCYTESUR NEGATIVE 09/09/2020 1105   Sepsis Labs: Invalid input(s): PROCALCITONIN, LACTICIDVEN   Time  coordinating discharge: 35 minutes  SIGNED:  Almon Herculesaye T Usbaldo Pannone, MD  [facility] 09/10/2020, 12:51 PM  If 7PM-7AM, please contact night-coverage [email or web address]

## 2020-09-10 NOTE — Progress Notes (Addendum)
Neurology Progress Note Subjective: No acute overnight events.  Patient back to baseline mental status per daughter at bedside.  Patient sitting up in recliner at bedside this morning, waiting on breakfast.  Patient is alert and interactive with examiner this morning.  Daughter reports that patient slept well overnight, feels that her altered mental status is due to the doubling of Depakote dosing at her facility.   Daughter also found out that the patient was given 1 mg of alprazolam about 30 minutes prior to her being brought in as a code stroke.  Exam: Vitals:   09/09/20 2241 09/09/20 2243  BP: (!) 149/119 124/68  Pulse:  (!) 55  Resp:    Temp:    SpO2:  (!) 89%   Gen: In recliner at bedside, sitting up and watching television.  Resp: non-labored breathing, no acute distress Abd: soft, non-distended, non-tender  Neuro: MS: Awake, alert, at baseline mental status. She recognizes her daughter at bedside and is able to state her first name. She is not able to recall time at baseline, thinks that she is in a hotel while here at the hospital. Often mimics examiner with commands but able to follow simple commands. Requires coaching to follow commands. Poor attention noted. Speech is without dysarthria.  Cranial Nerves: II: Visual Fields are full. Pupils are equal, round, and reactive to light 2 mm/ brisk III,IV, VI: EOMI, tracks examiner.  V: Sensation intact and symmetric to face VII: Face appears symmetric resting and grimacing VIII: Hearing is intact to voice X: Phonation intact.  XI: Shoulder shrug symmetric XII: Tongue protrudes midline Motor: Tone is normal. Bulk is normal.  4/5 strength throughout with antigravity movement and without pronator drift.  Sensory: Sensation to light touch intact and symmetric in upper and lower extremities  Deep Tendon Reflexes: 2+ and symmetric in the biceps and patellae. Cerebellar: Unable to assess due to patient not able to follow  complex commands   Pertinent Labs: CBC    Component Value Date/Time   WBC 6.7 09/10/2020 0349   RBC 3.53 (L) 09/10/2020 0349   HGB 11.7 (L) 09/10/2020 0349   HGB 13.3 06/30/2020 1024   HCT 33.6 (L) 09/10/2020 0349   HCT 39.6 06/30/2020 1024   PLT 230 09/10/2020 0349   PLT 303 06/30/2020 1024   MCV 95.2 09/10/2020 0349   MCV 96 06/30/2020 1024   MCH 33.1 09/10/2020 0349   MCHC 34.8 09/10/2020 0349   RDW 13.2 09/10/2020 0349   RDW 12.9 06/30/2020 1024   LYMPHSABS 2.0 09/09/2020 1000   MONOABS 0.8 09/09/2020 1000   EOSABS 0.0 09/09/2020 1000   BASOSABS 0.0 09/09/2020 1000  CMP     Component Value Date/Time   NA 141 09/10/2020 0349   NA 142 06/30/2020 1024   K 3.6 09/10/2020 0349   CL 110 09/10/2020 0349   CO2 23 09/10/2020 0349   GLUCOSE 84 09/10/2020 0349   BUN 10 09/10/2020 0349   BUN 15 06/30/2020 1024   CREATININE 0.73 09/10/2020 0349   CALCIUM 8.2 (L) 09/10/2020 0349   PROT 5.4 (L) 09/09/2020 1030   PROT 6.4 06/30/2020 1024   ALBUMIN 3.0 (L) 09/09/2020 1030   ALBUMIN 4.1 06/30/2020 1024   AST 23 09/09/2020 1030   ALT 16 09/09/2020 1030   ALKPHOS 80 09/09/2020 1030   BILITOT 0.6 09/09/2020 1030   BILITOT 0.5 06/30/2020 1024   GFRNONAA >60 09/10/2020 0349   GFRAA 72 06/30/2020 1024  Urinalysis    Component Value Date/Time  COLORURINE STRAW (A) 09/09/2020 1105   APPEARANCEUR CLEAR 09/09/2020 1105   APPEARANCEUR Clear 07/28/2020 1120   LABSPEC 1.009 09/09/2020 1105   PHURINE 7.0 09/09/2020 1105   GLUCOSEU NEGATIVE 09/09/2020 1105   GLUCOSEU NEGATIVE 02/13/2018 1417   HGBUR NEGATIVE 09/09/2020 1105   BILIRUBINUR NEGATIVE 09/09/2020 1105   BILIRUBINUR Negative 07/28/2020 1120   KETONESUR NEGATIVE 09/09/2020 1105   PROTEINUR NEGATIVE 09/09/2020 1105   UROBILINOGEN 0.2 02/13/2018 1417   NITRITE NEGATIVE 09/09/2020 1105   LEUKOCYTESUR NEGATIVE 09/09/2020 1105  Drugs of Abuse     Component Value Date/Time   LABOPIA NONE DETECTED 09/09/2020 1105    COCAINSCRNUR NONE DETECTED 09/09/2020 1105   LABBENZ POSITIVE (A) 09/09/2020 1105   AMPHETMU NONE DETECTED 09/09/2020 1105   THCU NONE DETECTED 09/09/2020 1105   LABBARB NONE DETECTED 09/09/2020 1105    Assessment: 86 year old female with history of Alzheimer's dementia with rapid memory decline and with mood disturbance moved to memory care unit yesterday with doubled dose of Depakote (250 mg daily) presents following syncope/near syncope event with initial systolic blood pressure of 70 for EMS. Varying symptoms present for 1-2 weeks with stumbling gait, tremor noted with walking and "drunk" feeling ambulation noted last night and a near syncopal event this morning with associated hypotension.  - Physical examination on arrival revealed stuporous patient with minimal interaction with examiner during neurologic examination and then suddenly opening eyes and following commands.  Question underlying behavioral disturbance with dementia. Improvement in mental status  --2/26; concern for overuse of sedating medications at facility. Per daughter, patient received 1 mg Xanax prior to depressed mental status.  - CT imaging without evidence of acute intracranial abnormality, large vessel occlusion, or hemodynamically significant stenosis. EEG with slowing MRI negative for stroke Impression: Alzheimer's dementia with mood disturbance and rapid memory decline and behavioral dysfunction especially in the setting of new placement into a memory disorders unit. Polypharmacy-especially Xanax Hypotension; possibly secondary to orthostasis less likely given negative orthostatics.   Recommendations: - Recommend home dose Depakote to remain at previous dosing 125 mg daily - Lexapro home medication dose adjust to 20 mg daily - Decrease Xanax to 0.25 mg PRN - Decrease home Trazodone to 50 mg PO at bedtime, as needed - Add 5 mg Melatonin PO nightly  - Caution with sedating medications/polypharmacy at living  facility  [facility], AGACNP-BC [facility] [phone]  Attending Neurohospitalist Addendum Patient seen and examined with APP/Resident. Agree with the history and physical as documented above. Agree with the plan as documented, which I helped formulate. I have independently reviewed the chart, obtained history, review of systems and examined the patient.I have personally reviewed pertinent head/neck/spine imaging (CT/MRI). Plan discussed with Dr. Alanda Slim Plans also discussed with daughter at bedside. Please feel free to call with any questions.  -- Milon Dikes, MD Neurologist [facility] Pager: [phone]

## 2020-09-10 NOTE — Evaluation (Signed)
Occupational Therapy Evaluation Patient Details Name: Jaclyn Robertson MRN: [id number] DOB: [birth year] Today's Date: 09/10/2020    History of Present Illness 86 year old female with history of Alzheimer's dementia with rapid memory decline and with mood disturbance moved to memory care unit yesterday with doubled dose of Depakote (250 mg daily) presents following syncope/near syncope event   Clinical Impression   Pt admitted with the above diagnoses and presents with below problem list. Pt will benefit from continued acute OT to address the below listed deficits and maximize independence with basic ADLs prior to d/c to venue below. At baseline pt requires assist with basic ADLs 2/2 moderate-advanced dementia, uses no AD to ambulate. Pt tolerated walking in the halls at min guard to min A level. Min A needed while walking d/t tendency to veer to either side and difficulty navigating large obstacles. Daughter present throughout session. Feel pt would benefit from continued OT at memory care unit for assistance in establishing routine and strategies for caregivers related to basic ADLs.      Follow Up Recommendations  Home health OT;Supervision/Assistance - 24 hour; back to Memory Care unit    Equipment Recommendations  None recommended by OT    Recommendations for Other Services       Precautions / Restrictions Precautions Precautions: Fall Restrictions Weight Bearing Restrictions: No      Mobility Bed Mobility               General bed mobility comments: up in chair    Transfers Overall transfer level: Needs assistance Equipment used: None Transfers: Sit to/from Stand Sit to Stand: Min guard         General transfer comment: Initially trialed rw but pt did better without AD. Min guard for safety to/from recliner    Balance Overall balance assessment: Needs assistance;History of Falls Sitting-balance support: Feet supported;Bilateral upper extremity  supported Sitting balance-Leahy Scale: Fair     Standing balance support: No upper extremity supported Standing balance-Leahy Scale: Fair                             ADL either performed or assessed with clinical judgement   ADL Overall ADL's : Needs assistance/impaired Eating/Feeding: Set up;Minimal assistance;Sitting   Grooming: Maximal assistance;Standing   Upper Body Bathing: Maximal assistance;Sitting   Lower Body Bathing: Maximal assistance;Sit to/from stand   Upper Body Dressing : Maximal assistance;Sitting   Lower Body Dressing: Maximal assistance;Sit to/from stand   Toilet Transfer: Minimal assistance;Min guard;Ambulation Toilet Transfer Details (indicate cue type and reason): difficulty navigating obstacles, min A to avoid collision with large obstacles ie sink Toileting- Clothing Manipulation and Hygiene: Maximal assistance;Sit to/from stand   Tub/ Shower Transfer: Min guard;Minimal assistance;Ambulation   Functional mobility during ADLs: Min guard;Minimal assistance General ADL Comments: Assist with ADLs consistent with moderate to advanced demetia. Transfers min guard, min guard to min A walking due to tendency to veer to both sides and redirection needed to avoid collusion with large obstacles     Vision         Perception     Praxis      Pertinent Vitals/Pain Pain Assessment: Faces Faces Pain Scale: Hurts a little bit Pain Location: L knee/thigh while walking Pain Descriptors / Indicators: Discomfort;Tightness Pain Intervention(s): Monitored during session     Hand Dominance     Extremity/Trunk Assessment Upper Extremity Assessment Upper Extremity Assessment: Generalized weakness;Difficult to assess due to impaired cognition  Lower Extremity Assessment Lower Extremity Assessment: Defer to PT evaluation       Communication Communication Communication: Other (comment) (deficits consistent with moderate-advanced dementia)    Cognition Arousal/Alertness: Awake/alert Behavior During Therapy: Flat affect Overall Cognitive Status: History of cognitive impairments - at baseline                                 General Comments: Oriented to first name only. Delayed reponse identifying daughter.   General Comments  Daughter present throughout session, provided PLOF and history. VSS, assessed before and after walking    Exercises     Shoulder Instructions      Home Living Family/patient expects to be discharged to:: Unsure                                 Additional Comments: likely back to memory care unit      Prior Functioning/Environment Level of Independence: Needs assistance  Gait / Transfers Assistance Needed: no AD at baseline ADL's / Homemaking Assistance Needed: daughter reports pt needs assist with bathing/dressing/grooming 2/2 advanced dementia            OT Problem List: Decreased strength;Decreased activity tolerance;Impaired balance (sitting and/or standing);Decreased cognition;Decreased safety awareness;Decreased knowledge of use of DME or AE;Decreased knowledge of precautions;Pain      OT Treatment/Interventions: Self-care/ADL training;Therapeutic exercise;DME and/or AE instruction;Therapeutic activities;Balance training;Patient/family education    OT Goals(Current goals can be found in the care plan section) Acute Rehab OT Goals Patient Stated Goal: daughter: improved balance while walking OT Goal Formulation: With family Time For Goal Achievement: 09/24/20 Potential to Achieve Goals: Fair ADL Goals Pt Will Transfer to Toilet: with modified independence;ambulating Pt Will Perform Tub/Shower Transfer: with supervision;ambulating  OT Frequency: Min 2X/week   Barriers to D/C:            Co-evaluation PT/OT/SLP Co-Evaluation/Treatment: Yes Reason for Co-Treatment: For patient/therapist safety;Necessary to address cognition/behavior during functional  activity   OT goals addressed during session: ADL's and self-care      AM-PAC OT "6 Clicks" Daily Activity     Outcome Measure Help from another person eating meals?: A Lot Help from another person taking care of personal grooming?: A Lot Help from another person toileting, which includes using toliet, bedpan, or urinal?: A Lot Help from another person bathing (including washing, rinsing, drying)?: A Lot Help from another person to put on and taking off regular upper body clothing?: A Lot Help from another person to put on and taking off regular lower body clothing?: A Lot 6 Click Score: 12   End of Session Equipment Utilized During Treatment: Gait belt;Rolling walker  Activity Tolerance: Patient tolerated treatment well Patient left: in chair;with call bell/phone within reach;with family/visitor present (daughter present)  OT Visit Diagnosis: Unsteadiness on feet (R26.81);Other abnormalities of gait and mobility (R26.89);Muscle weakness (generalized) (M62.81);History of falling (Z91.81);Other symptoms and signs involving cognitive function;Pain                Time: 2993-7169 OT Time Calculation (min): 33 min Charges:  OT General Charges $OT Visit: 1 Visit OT Evaluation $OT Eval Moderate Complexity: 1 Mod  Raynald Kemp, OT Acute Rehabilitation Services Pager: [phone] Office: [phone]   Pilar Grammes 09/10/2020, 10:56 AM

## 2020-09-10 NOTE — Evaluation (Signed)
Physical Therapy Evaluation Patient Details Name: Jaclyn Robertson MRN: [id number] DOB: [birth year] Today's Date: 09/10/2020   History of Present Illness  86 year old female with history of Alzheimer's dementia with rapid memory decline and with mood disturbance moved to memory care unit yesterday with doubled dose of Depakote (250 mg daily) presents following syncope/near syncope event  Clinical Impression  Pt admitted with above diagnosis. Pt sleepy initially but aroused with conversation and checking of BP. BP stable before and after ambulation. Pt began ambulation with RW but she is not familiar with it and was less safe with it, running into stationary obstacles. Without RW, pt mildly unsteady with R and L drift and some continued difficulty with obstacle navigation but no contact made with obstacles. Pt's daughter reports that pt was walking 1-1.5 mi/ day PTA and was steady. Recommend HHPT when she returns to memory care to work on balance reactions but also to help structure her day and acclimate to new environment.  Pt currently with functional limitations due to the deficits listed below (see PT Problem List). Pt will benefit from skilled PT to increase their independence and safety with mobility to allow discharge to the venue listed below.      Follow Up Recommendations Home health PT    Equipment Recommendations  None recommended by PT    Recommendations for Other Services       Precautions / Restrictions Precautions Precautions: Fall Restrictions Weight Bearing Restrictions: No      Mobility  Bed Mobility               General bed mobility comments: up in chair    Transfers Overall transfer level: Needs assistance Equipment used: None Transfers: Sit to/from Stand Sit to Stand: Min guard         General transfer comment: min-guard for safety  Ambulation/Gait Ambulation/Gait assistance: Min assist Gait Distance (Feet): 250 Feet Assistive device: Rolling  walker (2 wheeled);None Gait Pattern/deviations: Step-through pattern;Drifts right/left Gait velocity: WFL Gait velocity interpretation: >4.37 ft/sec, indicative of normal walking speed General Gait Details: began with RW but pt unfamiliar and unsafe with it. Ran into stationary objects on R and L side. Without RW but continued to drift R and L but did not make contact with anything. Gait pattern consistent with advancing dementia but per daughter, pt less steady than how her gait was described a few weeks ago.  Stairs            Wheelchair Mobility    Modified Rankin (Stroke Patients Only)       Balance Overall balance assessment: Needs assistance;History of Falls Sitting-balance support: Feet supported;Bilateral upper extremity supported Sitting balance-Leahy Scale: Fair     Standing balance support: No upper extremity supported Standing balance-Leahy Scale: Fair Standing balance comment: decreased righting reactions noted                             Pertinent Vitals/Pain Pain Assessment: Faces Faces Pain Scale: Hurts a little bit Pain Location: L knee/thigh while walking Pain Descriptors / Indicators: Discomfort;Tightness Pain Intervention(s): Monitored during session    Home Living Family/patient expects to be discharged to:: Assisted living               Home Equipment: None Additional Comments: returning to memory care unit    Prior Function Level of Independence: Needs assistance   Gait / Transfers Assistance Needed: independent at baseline until just before she came into  ED  ADL's / Homemaking Assistance Needed: daughter reports pt needs assist with bathing/dressing/grooming 2/2 advanced dementia  Comments: event happened in conjunction to pt being moved from home to memory care unit and some med changes     Hand Dominance        Extremity/Trunk Assessment   Upper Extremity Assessment Upper Extremity Assessment: Defer to OT  evaluation    Lower Extremity Assessment Lower Extremity Assessment: Difficult to assess due to impaired cognition    Cervical / Trunk Assessment Cervical / Trunk Assessment: Kyphotic  Communication   Communication: Expressive difficulties;Receptive difficulties  Cognition Arousal/Alertness: Awake/alert Behavior During Therapy: Flat affect Overall Cognitive Status: History of cognitive impairments - at baseline                                 General Comments: Oriented to first name only. Delayed reponse identifying daughter.      General Comments General comments (skin integrity, edema, etc.): BP stable before and after ambulation. Daughter present    Exercises     Assessment/Plan    PT Assessment Patient needs continued PT services  PT Problem List Decreased balance;Decreased mobility;Decreased coordination;Decreased cognition       PT Treatment Interventions Gait training;Functional mobility training;Therapeutic activities;Therapeutic exercise;Balance training;Patient/family education    PT Goals (Current goals can be found in the Care Plan section)  Acute Rehab PT Goals Patient Stated Goal: daughter: improved balance while walking PT Goal Formulation: With patient/family Time For Goal Achievement: 09/24/20 Potential to Achieve Goals: Good    Frequency Min 3X/week   Barriers to discharge        Co-evaluation PT/OT/SLP Co-Evaluation/Treatment: Yes Reason for Co-Treatment: Necessary to address cognition/behavior during functional activity PT goals addressed during session: Mobility/safety with mobility;Balance;Proper use of DME OT goals addressed during session: ADL's and self-care       AM-PAC PT "6 Clicks" Mobility  Outcome Measure Help needed turning from your back to your side while in a flat bed without using bedrails?: None Help needed moving from lying on your back to sitting on the side of a flat bed without using bedrails?: None Help  needed moving to and from a bed to a chair (including a wheelchair)?: A Little Help needed standing up from a chair using your arms (e.g., wheelchair or bedside chair)?: A Little Help needed to walk in hospital room?: A Little Help needed climbing 3-5 steps with a railing? : A Lot 6 Click Score: 19    End of Session Equipment Utilized During Treatment: Gait belt Activity Tolerance: Patient tolerated treatment well Patient left: in chair;with call bell/phone within reach;with family/visitor present Nurse Communication: Mobility status PT Visit Diagnosis: Unsteadiness on feet (R26.81)    Time: 6606-3016 PT Time Calculation (min) (ACUTE ONLY): 37 min   Charges:   PT Evaluation $PT Eval Moderate Complexity: 1 Mod          Lyanne Co, PT  Acute Rehab Services  Pager [phone] Office [phone]   Lawana Chambers Amika Tassin 09/10/2020, 11:50 AM

## 2020-09-13 DIAGNOSIS — Z9181 History of falling: Secondary | ICD-10-CM | POA: Diagnosis not present

## 2020-09-13 DIAGNOSIS — G309 Alzheimer's disease, unspecified: Secondary | ICD-10-CM | POA: Diagnosis not present

## 2020-09-13 DIAGNOSIS — F0281 Dementia in other diseases classified elsewhere with behavioral disturbance: Secondary | ICD-10-CM | POA: Diagnosis not present

## 2020-09-15 DIAGNOSIS — F39 Unspecified mood [affective] disorder: Secondary | ICD-10-CM | POA: Diagnosis not present

## 2020-09-15 DIAGNOSIS — F419 Anxiety disorder, unspecified: Secondary | ICD-10-CM | POA: Diagnosis not present

## 2020-09-15 DIAGNOSIS — F0390 Unspecified dementia without behavioral disturbance: Secondary | ICD-10-CM | POA: Diagnosis not present

## 2020-09-15 DIAGNOSIS — G4700 Insomnia, unspecified: Secondary | ICD-10-CM | POA: Diagnosis not present

## 2020-09-16 DIAGNOSIS — E568 Deficiency of other vitamins: Secondary | ICD-10-CM | POA: Diagnosis not present

## 2020-09-16 DIAGNOSIS — G478 Other sleep disorders: Secondary | ICD-10-CM | POA: Diagnosis not present

## 2020-09-16 DIAGNOSIS — M810 Age-related osteoporosis without current pathological fracture: Secondary | ICD-10-CM | POA: Diagnosis not present

## 2020-09-16 DIAGNOSIS — F0281 Dementia in other diseases classified elsewhere with behavioral disturbance: Secondary | ICD-10-CM | POA: Diagnosis not present

## 2020-09-16 DIAGNOSIS — G308 Other Alzheimer's disease: Secondary | ICD-10-CM | POA: Diagnosis not present

## 2020-09-16 DIAGNOSIS — R55 Syncope and collapse: Secondary | ICD-10-CM | POA: Diagnosis not present

## 2020-09-27 DIAGNOSIS — E559 Vitamin D deficiency, unspecified: Secondary | ICD-10-CM | POA: Diagnosis not present

## 2020-09-27 DIAGNOSIS — D519 Vitamin B12 deficiency anemia, unspecified: Secondary | ICD-10-CM | POA: Diagnosis not present

## 2020-09-27 DIAGNOSIS — E08311 Diabetes mellitus due to underlying condition with unspecified diabetic retinopathy with macular edema: Secondary | ICD-10-CM | POA: Diagnosis not present

## 2020-09-27 DIAGNOSIS — D508 Other iron deficiency anemias: Secondary | ICD-10-CM | POA: Diagnosis not present

## 2020-10-04 DIAGNOSIS — F0281 Dementia in other diseases classified elsewhere with behavioral disturbance: Secondary | ICD-10-CM | POA: Diagnosis not present

## 2020-10-04 DIAGNOSIS — G309 Alzheimer's disease, unspecified: Secondary | ICD-10-CM | POA: Diagnosis not present

## 2020-10-04 DIAGNOSIS — Z9181 History of falling: Secondary | ICD-10-CM | POA: Diagnosis not present

## 2020-10-11 DIAGNOSIS — G301 Alzheimer's disease with late onset: Secondary | ICD-10-CM | POA: Diagnosis not present

## 2020-10-11 DIAGNOSIS — F0281 Dementia in other diseases classified elsewhere with behavioral disturbance: Secondary | ICD-10-CM | POA: Diagnosis not present

## 2020-10-11 DIAGNOSIS — F064 Anxiety disorder due to known physiological condition: Secondary | ICD-10-CM | POA: Diagnosis not present

## 2020-10-11 DIAGNOSIS — Z9181 History of falling: Secondary | ICD-10-CM | POA: Diagnosis not present

## 2020-10-11 DIAGNOSIS — G309 Alzheimer's disease, unspecified: Secondary | ICD-10-CM | POA: Diagnosis not present

## 2020-10-11 DIAGNOSIS — G4701 Insomnia due to medical condition: Secondary | ICD-10-CM | POA: Diagnosis not present

## 2020-10-13 DIAGNOSIS — G301 Alzheimer's disease with late onset: Secondary | ICD-10-CM | POA: Diagnosis not present

## 2020-10-13 DIAGNOSIS — F419 Anxiety disorder, unspecified: Secondary | ICD-10-CM | POA: Diagnosis not present

## 2020-10-13 DIAGNOSIS — Z5181 Encounter for therapeutic drug level monitoring: Secondary | ICD-10-CM | POA: Diagnosis not present

## 2020-10-14 DIAGNOSIS — F0391 Unspecified dementia with behavioral disturbance: Secondary | ICD-10-CM | POA: Diagnosis not present

## 2020-10-14 DIAGNOSIS — M6281 Muscle weakness (generalized): Secondary | ICD-10-CM | POA: Diagnosis not present

## 2020-10-17 DIAGNOSIS — E559 Vitamin D deficiency, unspecified: Secondary | ICD-10-CM | POA: Diagnosis not present

## 2020-10-17 DIAGNOSIS — F0391 Unspecified dementia with behavioral disturbance: Secondary | ICD-10-CM | POA: Diagnosis not present

## 2020-10-17 DIAGNOSIS — M6281 Muscle weakness (generalized): Secondary | ICD-10-CM | POA: Diagnosis not present

## 2020-10-20 DIAGNOSIS — E039 Hypothyroidism, unspecified: Secondary | ICD-10-CM | POA: Diagnosis not present

## 2020-10-20 DIAGNOSIS — M6281 Muscle weakness (generalized): Secondary | ICD-10-CM | POA: Diagnosis not present

## 2020-10-20 DIAGNOSIS — E119 Type 2 diabetes mellitus without complications: Secondary | ICD-10-CM | POA: Diagnosis not present

## 2020-10-20 DIAGNOSIS — I10 Essential (primary) hypertension: Secondary | ICD-10-CM | POA: Diagnosis not present

## 2020-10-20 DIAGNOSIS — E559 Vitamin D deficiency, unspecified: Secondary | ICD-10-CM | POA: Diagnosis not present

## 2020-10-20 DIAGNOSIS — D519 Vitamin B12 deficiency anemia, unspecified: Secondary | ICD-10-CM | POA: Diagnosis not present

## 2020-10-20 DIAGNOSIS — F0391 Unspecified dementia with behavioral disturbance: Secondary | ICD-10-CM | POA: Diagnosis not present

## 2020-10-21 DIAGNOSIS — F0391 Unspecified dementia with behavioral disturbance: Secondary | ICD-10-CM | POA: Diagnosis not present

## 2020-10-21 DIAGNOSIS — M6281 Muscle weakness (generalized): Secondary | ICD-10-CM | POA: Diagnosis not present

## 2020-10-24 DIAGNOSIS — F0391 Unspecified dementia with behavioral disturbance: Secondary | ICD-10-CM | POA: Diagnosis not present

## 2020-10-24 DIAGNOSIS — M6281 Muscle weakness (generalized): Secondary | ICD-10-CM | POA: Diagnosis not present

## 2020-10-25 DIAGNOSIS — G301 Alzheimer's disease with late onset: Secondary | ICD-10-CM | POA: Diagnosis not present

## 2020-10-25 DIAGNOSIS — G4701 Insomnia due to medical condition: Secondary | ICD-10-CM | POA: Diagnosis not present

## 2020-10-25 DIAGNOSIS — F0391 Unspecified dementia with behavioral disturbance: Secondary | ICD-10-CM | POA: Diagnosis not present

## 2020-10-25 DIAGNOSIS — M6281 Muscle weakness (generalized): Secondary | ICD-10-CM | POA: Diagnosis not present

## 2020-10-25 DIAGNOSIS — F0281 Dementia in other diseases classified elsewhere with behavioral disturbance: Secondary | ICD-10-CM | POA: Diagnosis not present

## 2020-10-25 DIAGNOSIS — F064 Anxiety disorder due to known physiological condition: Secondary | ICD-10-CM | POA: Diagnosis not present

## 2020-10-27 DIAGNOSIS — F0391 Unspecified dementia with behavioral disturbance: Secondary | ICD-10-CM | POA: Diagnosis not present

## 2020-10-27 DIAGNOSIS — M6281 Muscle weakness (generalized): Secondary | ICD-10-CM | POA: Diagnosis not present

## 2020-10-31 DIAGNOSIS — F0391 Unspecified dementia with behavioral disturbance: Secondary | ICD-10-CM | POA: Diagnosis not present

## 2020-10-31 DIAGNOSIS — M6281 Muscle weakness (generalized): Secondary | ICD-10-CM | POA: Diagnosis not present

## 2020-11-02 DIAGNOSIS — F0391 Unspecified dementia with behavioral disturbance: Secondary | ICD-10-CM | POA: Diagnosis not present

## 2020-11-02 DIAGNOSIS — M6281 Muscle weakness (generalized): Secondary | ICD-10-CM | POA: Diagnosis not present

## 2020-11-04 DIAGNOSIS — M6281 Muscle weakness (generalized): Secondary | ICD-10-CM | POA: Diagnosis not present

## 2020-11-04 DIAGNOSIS — F0391 Unspecified dementia with behavioral disturbance: Secondary | ICD-10-CM | POA: Diagnosis not present

## 2020-11-07 DIAGNOSIS — M6281 Muscle weakness (generalized): Secondary | ICD-10-CM | POA: Diagnosis not present

## 2020-11-07 DIAGNOSIS — F0391 Unspecified dementia with behavioral disturbance: Secondary | ICD-10-CM | POA: Diagnosis not present

## 2020-11-08 DIAGNOSIS — G4701 Insomnia due to medical condition: Secondary | ICD-10-CM | POA: Diagnosis not present

## 2020-11-08 DIAGNOSIS — F064 Anxiety disorder due to known physiological condition: Secondary | ICD-10-CM | POA: Diagnosis not present

## 2020-11-08 DIAGNOSIS — G301 Alzheimer's disease with late onset: Secondary | ICD-10-CM | POA: Diagnosis not present

## 2020-11-08 DIAGNOSIS — F0281 Dementia in other diseases classified elsewhere with behavioral disturbance: Secondary | ICD-10-CM | POA: Diagnosis not present

## 2020-11-09 DIAGNOSIS — M6281 Muscle weakness (generalized): Secondary | ICD-10-CM | POA: Diagnosis not present

## 2020-11-09 DIAGNOSIS — F0391 Unspecified dementia with behavioral disturbance: Secondary | ICD-10-CM | POA: Diagnosis not present

## 2020-11-10 DIAGNOSIS — M6281 Muscle weakness (generalized): Secondary | ICD-10-CM | POA: Diagnosis not present

## 2020-11-10 DIAGNOSIS — F0391 Unspecified dementia with behavioral disturbance: Secondary | ICD-10-CM | POA: Diagnosis not present

## 2020-11-15 DIAGNOSIS — E559 Vitamin D deficiency, unspecified: Secondary | ICD-10-CM | POA: Diagnosis not present

## 2020-11-15 DIAGNOSIS — R451 Restlessness and agitation: Secondary | ICD-10-CM | POA: Diagnosis not present

## 2020-11-15 DIAGNOSIS — M6281 Muscle weakness (generalized): Secondary | ICD-10-CM | POA: Diagnosis not present

## 2020-11-15 DIAGNOSIS — F0391 Unspecified dementia with behavioral disturbance: Secondary | ICD-10-CM | POA: Diagnosis not present

## 2020-11-17 DIAGNOSIS — F0391 Unspecified dementia with behavioral disturbance: Secondary | ICD-10-CM | POA: Diagnosis not present

## 2020-11-17 DIAGNOSIS — M6281 Muscle weakness (generalized): Secondary | ICD-10-CM | POA: Diagnosis not present

## 2020-11-18 DIAGNOSIS — F064 Anxiety disorder due to known physiological condition: Secondary | ICD-10-CM | POA: Diagnosis not present

## 2020-11-18 DIAGNOSIS — F4320 Adjustment disorder, unspecified: Secondary | ICD-10-CM | POA: Diagnosis not present

## 2020-11-22 DIAGNOSIS — G301 Alzheimer's disease with late onset: Secondary | ICD-10-CM | POA: Diagnosis not present

## 2020-11-22 DIAGNOSIS — G4701 Insomnia due to medical condition: Secondary | ICD-10-CM | POA: Diagnosis not present

## 2020-11-22 DIAGNOSIS — F0281 Dementia in other diseases classified elsewhere with behavioral disturbance: Secondary | ICD-10-CM | POA: Diagnosis not present

## 2020-11-22 DIAGNOSIS — F064 Anxiety disorder due to known physiological condition: Secondary | ICD-10-CM | POA: Diagnosis not present

## 2020-11-28 DIAGNOSIS — M6281 Muscle weakness (generalized): Secondary | ICD-10-CM | POA: Diagnosis not present

## 2020-11-28 DIAGNOSIS — F0391 Unspecified dementia with behavioral disturbance: Secondary | ICD-10-CM | POA: Diagnosis not present

## 2020-11-29 DIAGNOSIS — M6281 Muscle weakness (generalized): Secondary | ICD-10-CM | POA: Diagnosis not present

## 2020-11-29 DIAGNOSIS — F0391 Unspecified dementia with behavioral disturbance: Secondary | ICD-10-CM | POA: Diagnosis not present

## 2020-11-30 DIAGNOSIS — F0391 Unspecified dementia with behavioral disturbance: Secondary | ICD-10-CM | POA: Diagnosis not present

## 2020-11-30 DIAGNOSIS — M6281 Muscle weakness (generalized): Secondary | ICD-10-CM | POA: Diagnosis not present

## 2020-12-02 DIAGNOSIS — F0391 Unspecified dementia with behavioral disturbance: Secondary | ICD-10-CM | POA: Diagnosis not present

## 2020-12-02 DIAGNOSIS — M6281 Muscle weakness (generalized): Secondary | ICD-10-CM | POA: Diagnosis not present

## 2020-12-05 DIAGNOSIS — F0391 Unspecified dementia with behavioral disturbance: Secondary | ICD-10-CM | POA: Diagnosis not present

## 2020-12-05 DIAGNOSIS — M6281 Muscle weakness (generalized): Secondary | ICD-10-CM | POA: Diagnosis not present

## 2020-12-06 DIAGNOSIS — G301 Alzheimer's disease with late onset: Secondary | ICD-10-CM | POA: Diagnosis not present

## 2020-12-06 DIAGNOSIS — F0281 Dementia in other diseases classified elsewhere with behavioral disturbance: Secondary | ICD-10-CM | POA: Diagnosis not present

## 2020-12-06 DIAGNOSIS — F064 Anxiety disorder due to known physiological condition: Secondary | ICD-10-CM | POA: Diagnosis not present

## 2020-12-06 DIAGNOSIS — G4701 Insomnia due to medical condition: Secondary | ICD-10-CM | POA: Diagnosis not present

## 2020-12-07 DIAGNOSIS — M6281 Muscle weakness (generalized): Secondary | ICD-10-CM | POA: Diagnosis not present

## 2020-12-07 DIAGNOSIS — F0391 Unspecified dementia with behavioral disturbance: Secondary | ICD-10-CM | POA: Diagnosis not present

## 2020-12-08 DIAGNOSIS — F0391 Unspecified dementia with behavioral disturbance: Secondary | ICD-10-CM | POA: Diagnosis not present

## 2020-12-08 DIAGNOSIS — M6281 Muscle weakness (generalized): Secondary | ICD-10-CM | POA: Diagnosis not present

## 2020-12-09 DIAGNOSIS — F419 Anxiety disorder, unspecified: Secondary | ICD-10-CM | POA: Diagnosis not present

## 2020-12-09 DIAGNOSIS — G301 Alzheimer's disease with late onset: Secondary | ICD-10-CM | POA: Diagnosis not present

## 2020-12-12 DIAGNOSIS — K5901 Slow transit constipation: Secondary | ICD-10-CM | POA: Diagnosis not present

## 2020-12-12 DIAGNOSIS — M6281 Muscle weakness (generalized): Secondary | ICD-10-CM | POA: Diagnosis not present

## 2020-12-12 DIAGNOSIS — E559 Vitamin D deficiency, unspecified: Secondary | ICD-10-CM | POA: Diagnosis not present

## 2020-12-13 DIAGNOSIS — M6281 Muscle weakness (generalized): Secondary | ICD-10-CM | POA: Diagnosis not present

## 2020-12-13 DIAGNOSIS — F0391 Unspecified dementia with behavioral disturbance: Secondary | ICD-10-CM | POA: Diagnosis not present

## 2020-12-15 DIAGNOSIS — F0391 Unspecified dementia with behavioral disturbance: Secondary | ICD-10-CM | POA: Diagnosis not present

## 2020-12-15 DIAGNOSIS — M6281 Muscle weakness (generalized): Secondary | ICD-10-CM | POA: Diagnosis not present

## 2020-12-19 DIAGNOSIS — M6281 Muscle weakness (generalized): Secondary | ICD-10-CM | POA: Diagnosis not present

## 2020-12-19 DIAGNOSIS — F0391 Unspecified dementia with behavioral disturbance: Secondary | ICD-10-CM | POA: Diagnosis not present

## 2020-12-20 DIAGNOSIS — F064 Anxiety disorder due to known physiological condition: Secondary | ICD-10-CM | POA: Diagnosis not present

## 2020-12-20 DIAGNOSIS — G301 Alzheimer's disease with late onset: Secondary | ICD-10-CM | POA: Diagnosis not present

## 2020-12-22 DIAGNOSIS — M6281 Muscle weakness (generalized): Secondary | ICD-10-CM | POA: Diagnosis not present

## 2020-12-22 DIAGNOSIS — F0391 Unspecified dementia with behavioral disturbance: Secondary | ICD-10-CM | POA: Diagnosis not present

## 2021-01-03 DIAGNOSIS — F331 Major depressive disorder, recurrent, moderate: Secondary | ICD-10-CM | POA: Diagnosis not present

## 2021-01-04 DIAGNOSIS — F331 Major depressive disorder, recurrent, moderate: Secondary | ICD-10-CM | POA: Diagnosis not present

## 2021-01-04 DIAGNOSIS — F0281 Dementia in other diseases classified elsewhere with behavioral disturbance: Secondary | ICD-10-CM | POA: Diagnosis not present

## 2021-01-04 DIAGNOSIS — F064 Anxiety disorder due to known physiological condition: Secondary | ICD-10-CM | POA: Diagnosis not present

## 2021-01-04 DIAGNOSIS — G4701 Insomnia due to medical condition: Secondary | ICD-10-CM | POA: Diagnosis not present

## 2021-01-04 DIAGNOSIS — G301 Alzheimer's disease with late onset: Secondary | ICD-10-CM | POA: Diagnosis not present

## 2021-01-09 DIAGNOSIS — K5901 Slow transit constipation: Secondary | ICD-10-CM | POA: Diagnosis not present

## 2021-01-09 DIAGNOSIS — M6281 Muscle weakness (generalized): Secondary | ICD-10-CM | POA: Diagnosis not present

## 2021-01-09 DIAGNOSIS — E559 Vitamin D deficiency, unspecified: Secondary | ICD-10-CM | POA: Diagnosis not present

## 2021-01-17 ENCOUNTER — Ambulatory Visit: Payer: PPO | Admitting: Neurology

## 2021-01-17 DIAGNOSIS — G4701 Insomnia due to medical condition: Secondary | ICD-10-CM | POA: Diagnosis not present

## 2021-01-17 DIAGNOSIS — F331 Major depressive disorder, recurrent, moderate: Secondary | ICD-10-CM | POA: Diagnosis not present

## 2021-01-17 DIAGNOSIS — F064 Anxiety disorder due to known physiological condition: Secondary | ICD-10-CM | POA: Diagnosis not present

## 2021-01-17 DIAGNOSIS — G301 Alzheimer's disease with late onset: Secondary | ICD-10-CM | POA: Diagnosis not present

## 2021-01-17 DIAGNOSIS — F0281 Dementia in other diseases classified elsewhere with behavioral disturbance: Secondary | ICD-10-CM | POA: Diagnosis not present

## 2021-02-05 DIAGNOSIS — F321 Major depressive disorder, single episode, moderate: Secondary | ICD-10-CM | POA: Diagnosis not present

## 2021-02-05 DIAGNOSIS — G301 Alzheimer's disease with late onset: Secondary | ICD-10-CM | POA: Diagnosis not present

## 2021-02-06 DIAGNOSIS — M6281 Muscle weakness (generalized): Secondary | ICD-10-CM | POA: Diagnosis not present

## 2021-02-06 DIAGNOSIS — K5901 Slow transit constipation: Secondary | ICD-10-CM | POA: Diagnosis not present

## 2021-02-06 DIAGNOSIS — E559 Vitamin D deficiency, unspecified: Secondary | ICD-10-CM | POA: Diagnosis not present

## 2021-02-07 DIAGNOSIS — N390 Urinary tract infection, site not specified: Secondary | ICD-10-CM | POA: Diagnosis not present

## 2021-02-10 DIAGNOSIS — F4320 Adjustment disorder, unspecified: Secondary | ICD-10-CM | POA: Diagnosis not present

## 2021-02-14 DIAGNOSIS — F0281 Dementia in other diseases classified elsewhere with behavioral disturbance: Secondary | ICD-10-CM | POA: Diagnosis not present

## 2021-02-14 DIAGNOSIS — F331 Major depressive disorder, recurrent, moderate: Secondary | ICD-10-CM | POA: Diagnosis not present

## 2021-02-14 DIAGNOSIS — F064 Anxiety disorder due to known physiological condition: Secondary | ICD-10-CM | POA: Diagnosis not present

## 2021-02-14 DIAGNOSIS — G4701 Insomnia due to medical condition: Secondary | ICD-10-CM | POA: Diagnosis not present

## 2021-02-15 DIAGNOSIS — K5901 Slow transit constipation: Secondary | ICD-10-CM | POA: Diagnosis not present

## 2021-02-15 DIAGNOSIS — E559 Vitamin D deficiency, unspecified: Secondary | ICD-10-CM | POA: Diagnosis not present

## 2021-02-15 DIAGNOSIS — M6281 Muscle weakness (generalized): Secondary | ICD-10-CM | POA: Diagnosis not present

## 2021-03-03 DIAGNOSIS — G4701 Insomnia due to medical condition: Secondary | ICD-10-CM | POA: Diagnosis not present

## 2021-03-03 DIAGNOSIS — F331 Major depressive disorder, recurrent, moderate: Secondary | ICD-10-CM | POA: Diagnosis not present

## 2021-03-03 DIAGNOSIS — G301 Alzheimer's disease with late onset: Secondary | ICD-10-CM | POA: Diagnosis not present

## 2021-03-03 DIAGNOSIS — F0281 Dementia in other diseases classified elsewhere with behavioral disturbance: Secondary | ICD-10-CM | POA: Diagnosis not present

## 2021-03-03 DIAGNOSIS — F064 Anxiety disorder due to known physiological condition: Secondary | ICD-10-CM | POA: Diagnosis not present

## 2021-03-13 DIAGNOSIS — E559 Vitamin D deficiency, unspecified: Secondary | ICD-10-CM | POA: Diagnosis not present

## 2021-03-13 DIAGNOSIS — K5901 Slow transit constipation: Secondary | ICD-10-CM | POA: Diagnosis not present

## 2021-03-13 DIAGNOSIS — M6281 Muscle weakness (generalized): Secondary | ICD-10-CM | POA: Diagnosis not present

## 2021-03-19 DIAGNOSIS — R0981 Nasal congestion: Secondary | ICD-10-CM | POA: Diagnosis not present

## 2021-03-19 DIAGNOSIS — R051 Acute cough: Secondary | ICD-10-CM | POA: Diagnosis not present

## 2021-03-19 DIAGNOSIS — J029 Acute pharyngitis, unspecified: Secondary | ICD-10-CM | POA: Diagnosis not present

## 2021-03-20 DIAGNOSIS — U071 COVID-19: Secondary | ICD-10-CM | POA: Diagnosis not present

## 2021-03-24 DIAGNOSIS — F331 Major depressive disorder, recurrent, moderate: Secondary | ICD-10-CM | POA: Diagnosis not present

## 2021-03-24 DIAGNOSIS — G4701 Insomnia due to medical condition: Secondary | ICD-10-CM | POA: Diagnosis not present

## 2021-03-24 DIAGNOSIS — F0281 Dementia in other diseases classified elsewhere with behavioral disturbance: Secondary | ICD-10-CM | POA: Diagnosis not present

## 2021-03-24 DIAGNOSIS — F064 Anxiety disorder due to known physiological condition: Secondary | ICD-10-CM | POA: Diagnosis not present

## 2021-03-24 DIAGNOSIS — G301 Alzheimer's disease with late onset: Secondary | ICD-10-CM | POA: Diagnosis not present

## 2021-03-27 DIAGNOSIS — U071 COVID-19: Secondary | ICD-10-CM | POA: Diagnosis not present

## 2021-03-30 DIAGNOSIS — Z79899 Other long term (current) drug therapy: Secondary | ICD-10-CM | POA: Diagnosis not present

## 2021-04-10 DIAGNOSIS — M6281 Muscle weakness (generalized): Secondary | ICD-10-CM | POA: Diagnosis not present

## 2021-04-10 DIAGNOSIS — K5901 Slow transit constipation: Secondary | ICD-10-CM | POA: Diagnosis not present

## 2021-04-10 DIAGNOSIS — E559 Vitamin D deficiency, unspecified: Secondary | ICD-10-CM | POA: Diagnosis not present

## 2021-04-13 DIAGNOSIS — G301 Alzheimer's disease with late onset: Secondary | ICD-10-CM | POA: Diagnosis not present

## 2021-04-13 DIAGNOSIS — F321 Major depressive disorder, single episode, moderate: Secondary | ICD-10-CM | POA: Diagnosis not present

## 2021-04-14 DIAGNOSIS — F0281 Dementia in other diseases classified elsewhere with behavioral disturbance: Secondary | ICD-10-CM | POA: Diagnosis not present

## 2021-04-14 DIAGNOSIS — G4701 Insomnia due to medical condition: Secondary | ICD-10-CM | POA: Diagnosis not present

## 2021-04-14 DIAGNOSIS — F064 Anxiety disorder due to known physiological condition: Secondary | ICD-10-CM | POA: Diagnosis not present

## 2021-04-14 DIAGNOSIS — G301 Alzheimer's disease with late onset: Secondary | ICD-10-CM | POA: Diagnosis not present

## 2021-04-14 DIAGNOSIS — F331 Major depressive disorder, recurrent, moderate: Secondary | ICD-10-CM | POA: Diagnosis not present

## 2021-04-28 DIAGNOSIS — F331 Major depressive disorder, recurrent, moderate: Secondary | ICD-10-CM | POA: Diagnosis not present

## 2021-04-28 DIAGNOSIS — F02C2 Dementia in other diseases classified elsewhere, severe, with psychotic disturbance: Secondary | ICD-10-CM | POA: Diagnosis not present

## 2021-04-28 DIAGNOSIS — F064 Anxiety disorder due to known physiological condition: Secondary | ICD-10-CM | POA: Diagnosis not present

## 2021-04-28 DIAGNOSIS — G301 Alzheimer's disease with late onset: Secondary | ICD-10-CM | POA: Diagnosis not present

## 2021-04-28 DIAGNOSIS — G4701 Insomnia due to medical condition: Secondary | ICD-10-CM | POA: Diagnosis not present

## 2021-05-05 DIAGNOSIS — F331 Major depressive disorder, recurrent, moderate: Secondary | ICD-10-CM | POA: Diagnosis not present

## 2021-05-10 DIAGNOSIS — M6281 Muscle weakness (generalized): Secondary | ICD-10-CM | POA: Diagnosis not present

## 2021-05-10 DIAGNOSIS — E559 Vitamin D deficiency, unspecified: Secondary | ICD-10-CM | POA: Diagnosis not present

## 2021-05-10 DIAGNOSIS — K5901 Slow transit constipation: Secondary | ICD-10-CM | POA: Diagnosis not present

## 2021-05-11 DIAGNOSIS — G301 Alzheimer's disease with late onset: Secondary | ICD-10-CM | POA: Diagnosis not present

## 2021-05-11 DIAGNOSIS — F321 Major depressive disorder, single episode, moderate: Secondary | ICD-10-CM | POA: Diagnosis not present

## 2021-05-12 DIAGNOSIS — F02C2 Dementia in other diseases classified elsewhere, severe, with psychotic disturbance: Secondary | ICD-10-CM | POA: Diagnosis not present

## 2021-05-12 DIAGNOSIS — G301 Alzheimer's disease with late onset: Secondary | ICD-10-CM | POA: Diagnosis not present

## 2021-05-15 DIAGNOSIS — E559 Vitamin D deficiency, unspecified: Secondary | ICD-10-CM | POA: Diagnosis not present

## 2021-05-15 DIAGNOSIS — M6281 Muscle weakness (generalized): Secondary | ICD-10-CM | POA: Diagnosis not present

## 2021-05-15 DIAGNOSIS — K5901 Slow transit constipation: Secondary | ICD-10-CM | POA: Diagnosis not present

## 2021-05-22 DIAGNOSIS — F331 Major depressive disorder, recurrent, moderate: Secondary | ICD-10-CM | POA: Diagnosis not present

## 2021-05-22 DIAGNOSIS — F02C2 Dementia in other diseases classified elsewhere, severe, with psychotic disturbance: Secondary | ICD-10-CM | POA: Diagnosis not present

## 2021-05-22 DIAGNOSIS — G301 Alzheimer's disease with late onset: Secondary | ICD-10-CM | POA: Diagnosis not present

## 2021-05-22 DIAGNOSIS — G4701 Insomnia due to medical condition: Secondary | ICD-10-CM | POA: Diagnosis not present

## 2021-05-22 DIAGNOSIS — F419 Anxiety disorder, unspecified: Secondary | ICD-10-CM | POA: Diagnosis not present

## 2021-06-09 DIAGNOSIS — F02C2 Dementia in other diseases classified elsewhere, severe, with psychotic disturbance: Secondary | ICD-10-CM | POA: Diagnosis not present

## 2021-06-09 DIAGNOSIS — G301 Alzheimer's disease with late onset: Secondary | ICD-10-CM | POA: Diagnosis not present

## 2021-06-09 DIAGNOSIS — F331 Major depressive disorder, recurrent, moderate: Secondary | ICD-10-CM | POA: Diagnosis not present

## 2021-06-12 DIAGNOSIS — K5901 Slow transit constipation: Secondary | ICD-10-CM | POA: Diagnosis not present

## 2021-06-12 DIAGNOSIS — M6281 Muscle weakness (generalized): Secondary | ICD-10-CM | POA: Diagnosis not present

## 2021-06-12 DIAGNOSIS — E559 Vitamin D deficiency, unspecified: Secondary | ICD-10-CM | POA: Diagnosis not present

## 2021-06-23 DIAGNOSIS — F02C2 Dementia in other diseases classified elsewhere, severe, with psychotic disturbance: Secondary | ICD-10-CM | POA: Diagnosis not present

## 2021-06-23 DIAGNOSIS — B351 Tinea unguium: Secondary | ICD-10-CM | POA: Diagnosis not present

## 2021-06-23 DIAGNOSIS — G4701 Insomnia due to medical condition: Secondary | ICD-10-CM | POA: Diagnosis not present

## 2021-06-23 DIAGNOSIS — E559 Vitamin D deficiency, unspecified: Secondary | ICD-10-CM | POA: Diagnosis not present

## 2021-06-23 DIAGNOSIS — G301 Alzheimer's disease with late onset: Secondary | ICD-10-CM | POA: Diagnosis not present

## 2021-06-23 DIAGNOSIS — F064 Anxiety disorder due to known physiological condition: Secondary | ICD-10-CM | POA: Diagnosis not present

## 2021-06-23 DIAGNOSIS — F331 Major depressive disorder, recurrent, moderate: Secondary | ICD-10-CM | POA: Diagnosis not present

## 2021-06-26 DIAGNOSIS — Z8616 Personal history of COVID-19: Secondary | ICD-10-CM | POA: Diagnosis not present

## 2021-06-26 DIAGNOSIS — G4701 Insomnia due to medical condition: Secondary | ICD-10-CM | POA: Diagnosis not present

## 2021-06-26 DIAGNOSIS — F064 Anxiety disorder due to known physiological condition: Secondary | ICD-10-CM | POA: Diagnosis not present

## 2021-07-09 ENCOUNTER — Emergency Department (HOSPITAL_BASED_OUTPATIENT_CLINIC_OR_DEPARTMENT_OTHER): Payer: PPO

## 2021-07-09 ENCOUNTER — Encounter (HOSPITAL_BASED_OUTPATIENT_CLINIC_OR_DEPARTMENT_OTHER): Payer: Self-pay

## 2021-07-09 ENCOUNTER — Other Ambulatory Visit: Payer: Self-pay

## 2021-07-09 ENCOUNTER — Emergency Department (HOSPITAL_BASED_OUTPATIENT_CLINIC_OR_DEPARTMENT_OTHER)
Admission: EM | Admit: 2021-07-09 | Discharge: 2021-07-09 | Disposition: A | Discharge status: Home / Self Care | Payer: PPO | Attending: Emergency Medicine | Admitting: Emergency Medicine

## 2021-07-09 DIAGNOSIS — G301 Alzheimer's disease with late onset: Secondary | ICD-10-CM | POA: Diagnosis not present

## 2021-07-09 DIAGNOSIS — Z79899 Other long term (current) drug therapy: Secondary | ICD-10-CM | POA: Insufficient documentation

## 2021-07-09 DIAGNOSIS — S60221A Contusion of right hand, initial encounter: Secondary | ICD-10-CM

## 2021-07-09 DIAGNOSIS — T148XXA Other injury of unspecified body region, initial encounter: Secondary | ICD-10-CM

## 2021-07-09 DIAGNOSIS — Z20822 Contact with and (suspected) exposure to covid-19: Secondary | ICD-10-CM | POA: Insufficient documentation

## 2021-07-09 DIAGNOSIS — R404 Transient alteration of awareness: Secondary | ICD-10-CM | POA: Diagnosis not present

## 2021-07-09 DIAGNOSIS — R42 Dizziness and giddiness: Secondary | ICD-10-CM | POA: Diagnosis not present

## 2021-07-09 DIAGNOSIS — S0990XA Unspecified injury of head, initial encounter: Secondary | ICD-10-CM | POA: Diagnosis not present

## 2021-07-09 DIAGNOSIS — S6991XA Unspecified injury of right wrist, hand and finger(s), initial encounter: Secondary | ICD-10-CM | POA: Diagnosis not present

## 2021-07-09 DIAGNOSIS — F02818 Dementia in other diseases classified elsewhere, unspecified severity, with other behavioral disturbance: Secondary | ICD-10-CM | POA: Insufficient documentation

## 2021-07-09 DIAGNOSIS — R0789 Other chest pain: Secondary | ICD-10-CM | POA: Diagnosis not present

## 2021-07-09 DIAGNOSIS — W19XXXA Unspecified fall, initial encounter: Secondary | ICD-10-CM | POA: Insufficient documentation

## 2021-07-09 DIAGNOSIS — R079 Chest pain, unspecified: Secondary | ICD-10-CM | POA: Diagnosis not present

## 2021-07-09 DIAGNOSIS — G4489 Other headache syndrome: Secondary | ICD-10-CM | POA: Diagnosis not present

## 2021-07-09 DIAGNOSIS — R9431 Abnormal electrocardiogram [ECG] [EKG]: Secondary | ICD-10-CM | POA: Diagnosis not present

## 2021-07-09 DIAGNOSIS — M47812 Spondylosis without myelopathy or radiculopathy, cervical region: Secondary | ICD-10-CM | POA: Diagnosis not present

## 2021-07-09 LAB — COMPREHENSIVE METABOLIC PANEL
ALT: 11 U/L (ref 0–44)
AST: 16 U/L (ref 15–41)
Albumin: 3.7 g/dL (ref 3.5–5.0)
Alkaline Phosphatase: 61 U/L (ref 38–126)
Anion gap: 7 (ref 5–15)
BUN: 18 mg/dL (ref 8–23)
CO2: 28 mmol/L (ref 22–32)
Calcium: 8.6 mg/dL — ABNORMAL LOW (ref 8.9–10.3)
Chloride: 105 mmol/L (ref 98–111)
Creatinine, Ser: 0.81 mg/dL (ref 0.44–1.00)
GFR, Estimated: >60 mL/min (ref >60)
Glucose, Bld: 81 mg/dL (ref 70–99)
Potassium: 4.6 mmol/L (ref 3.5–5.1)
Sodium: 140 mmol/L (ref 135–145)
Total Bilirubin: 0.6 mg/dL (ref 0.3–1.2)
Total Protein: 6.5 g/dL (ref 6.5–8.1)

## 2021-07-09 LAB — RESP PANEL BY RT-PCR (FLU A&B, COVID) ARPGX2
Influenza A by PCR: NEGATIVE
Influenza B by PCR: NEGATIVE
SARS Coronavirus 2 by RT PCR: NEGATIVE

## 2021-07-09 LAB — CBC
HCT: 34.8 % — ABNORMAL LOW (ref 36.0–46.0)
Hemoglobin: 11.5 g/dL — ABNORMAL LOW (ref 12.0–15.0)
MCH: 31.6 pg (ref 26.0–34.0)
MCHC: 33 g/dL (ref 30.0–36.0)
MCV: 95.6 fL (ref 80.0–100.0)
Platelets: 238 10*3/uL (ref 150–400)
RBC: 3.64 MIL/uL — ABNORMAL LOW (ref 3.87–5.11)
RDW: 13.6 % (ref 11.5–15.5)
WBC: 8.8 10*3/uL (ref 4.0–10.5)
nRBC: 0 % (ref 0.0–0.2)

## 2021-07-09 LAB — PROTIME-INR
INR: 1.1 (ref 0.8–1.2)
Prothrombin Time: 13.8 seconds (ref 11.4–15.2)

## 2021-07-09 NOTE — Discharge Instructions (Addendum)
You were evaluated in the Emergency Department and after careful evaluation, we did not find any emergent condition requiring admission or further testing in the hospital.  Your exam/testing today was overall reassuring.  Your CT imaging and x-ray imaging was negative for acute traumatic injuries.  Please return to the Emergency Department if you experience any worsening of your condition.  Thank you for allowing [place] to be a part of your care.

## 2021-07-09 NOTE — ED Notes (Addendum)
Pt son at bedside. Pt and Provider Ernie Avena found it appropriate for Pt to be transported back to facility by the Son. Discharge papers given to Son, all questions and concerns addressed.

## 2021-07-09 NOTE — ED Provider Notes (Signed)
[facility] EMERGENCY DEPT Provider Note   CSN: [id number] Arrival date & time: 07/09/21  6606     History Chief Complaint  Patient presents with   fall yesterday    Jaclyn Robertson is a 86 y.o. female.  HPI  86 year old female with a history of Alzheimer's dementia at a memory care facility who presents to the emergency department after a fall.  The patient presents from [facility] memory care facility after an unwitnessed fall yesterday.  Unclear head trauma or loss of consciousness.  She has been holding her right wrist and in the complaining of some pain with some redness in the area of her wrist.  The patient was ambulatory after the event with no other clear signs of injury.  At her baseline mental status per her son who is bedside.  She had previously been given Xanax prior to transfer to the hospital but according to the patient's son she is at her baseline.  Level 5 caveat due to patient dementia.  Past Medical History:  Diagnosis Date   Dementia [facility])     Patient Active Problem List   Diagnosis Date Noted   Syncope and collapse 09/09/2020   Age-related osteoporosis without current pathological fracture 11/02/2019   Compression deformity of vertebra 09/29/2019   Late onset Alzheimer's disease without behavioral disturbance (HCC) 11/18/2017    Past Surgical History:  Procedure Laterality Date   CATARACT EXTRACTION W/PHACO Left 09/13/2017   Procedure: CATARACT EXTRACTION PHACO AND INTRAOCULAR LENS PLACEMENT (IOC);  Surgeon: Fabio Pierce, MD;  Location: [facility]S;  Service: Ophthalmology;  Laterality: Left;  CDE: 4.81   CATARACT EXTRACTION W/PHACO Right 11/15/2017   Procedure: CATARACT EXTRACTION PHACO AND INTRAOCULAR LENS PLACEMENT (IOC);  Surgeon: Fabio Pierce, MD;  Location: [facility];  Service: Ophthalmology;  Laterality: Right;  CDE: 5.96     OB History   No obstetric history on file.     Family History  Problem Relation Age of Onset   Seizures  Mother     Social History   Tobacco Use   Smoking status: Never   Smokeless tobacco: Never  Vaping Use   Vaping Use: Never used  Substance Use Topics   Alcohol use: Yes    Alcohol/week: 2.0 standard drinks    Types: 2 Glasses of wine per week    Comment: daily - less in the last few weeks   Drug use: No    Home Medications Prior to Admission medications   Medication Sig Start Date End Date Taking? Authorizing Provider  ALPRAZolam (XANAX) 0.25 MG tablet Take 1 tablet (0.25 mg total) by mouth 2 (two) times daily as needed for anxiety. 09/10/20   Almon Hercules, MD  Cholecalciferol (VITAMIN D-3) 125 MCG (5000 UT) TABS Take 1 tablet by mouth daily. Patient taking differently: Take 5,000 Units by mouth daily. 10/06/19   Hilts, Casimiro Needle, MD  divalproex (DEPAKOTE) 125 MG DR tablet Take 1 tablet (125 mg total) by mouth daily. 09/10/20   Almon Hercules, MD  escitalopram (LEXAPRO) 20 MG tablet Take 1 tablet (20 mg total) by mouth daily. 09/10/20   Almon Hercules, MD  Melatonin 5 MG CAPS Take 1 capsule (5 mg total) by mouth at bedtime. 09/10/20   Almon Hercules, MD  memantine (NAMENDA) 10 MG tablet Take 1 tablet (10 mg total) by mouth 2 (two) times daily. 05/13/20   Van Clines, MD  Menatetrenone (VITAMIN K2) 100 MCG TABS Take 100 mcg by mouth daily. 10/06/19  Hilts, Michael, MD  raloxifene (EVISTA) 60 MG tablet Take 60 mg by mouth once daily Patient taking differently: Take 60 mg by mouth daily. 11/02/19   Mechele Claude, MD  traZODone (DESYREL) 50 MG tablet Take 1 tablet (50 mg total) by mouth at bedtime. 09/10/20   Almon Hercules, MD    Allergies    Levaquin [levofloxacin in d5w]  Review of Systems   Review of Systems  Unable to perform ROS: Dementia   Physical Exam Updated Vital Signs BP 104/62    Pulse 66    Temp 98 F (36.7 C)    Resp 15    SpO2 98%   Physical Exam Vitals and nursing note reviewed.  Constitutional:      General: She is not in acute distress.    Appearance: She is  well-developed.     Comments: GCS 14, ABC intact, at baseline dementia  HENT:     Head: Normocephalic and atraumatic.  Eyes:     Extraocular Movements: Extraocular movements intact.     Conjunctiva/sclera: Conjunctivae normal.     Pupils: Pupils are equal, round, and reactive to light.  Neck:     Comments: No midline tenderness to palpation of the cervical spine.  Range of motion intact Cardiovascular:     Rate and Rhythm: Normal rate and regular rhythm.     Heart sounds: No murmur heard. Pulmonary:     Effort: Pulmonary effort is normal. No respiratory distress.     Breath sounds: Normal breath sounds.  Chest:     Comments: Clavicles stable nontender to AP compression.  Chest wall stable and nontender to AP and lateral compression. Abdominal:     Palpations: Abdomen is soft.     Tenderness: There is no abdominal tenderness.  Musculoskeletal:     Cervical back: Neck supple.     Comments: No midline tenderness to palpation of the thoracic or lumbar spine.  Right hand with a dorsal discoloration consistent with bruising/hematoma, 2+ radial pulses, intact motor function along the median, ulnar, radial nerve distribution.  Skin:    General: Skin is warm and dry.  Neurological:     Mental Status: She is alert.     Comments: Cranial nerves II through XII grossly intact.  Moving all 4 extremities spontaneously.  Sensation grossly intact all 4 extremities.  At baseline dementia, GCS 14    ED Results / Procedures / Treatments   Labs (all labs ordered are listed, but only abnormal results are displayed) Labs Reviewed  COMPREHENSIVE METABOLIC PANEL - Abnormal; Notable for the following components:      Result Value   Calcium 8.6 (*)    All other components within normal limits  CBC - Abnormal; Notable for the following components:   RBC 3.64 (*)    Hemoglobin 11.5 (*)    HCT 34.8 (*)    All other components within normal limits  RESP PANEL BY RT-PCR (FLU A&B, COVID) [id number]   PROTIME-INR    EKG EKG Interpretation  Date/Time:  Sunday July 09 2021 08:59:17 EST Ventricular Rate:  63 PR Interval:  140 QRS Duration: 84 QT Interval:  424 QTC Calculation: 434 R Axis:   65 Text Interpretation: Sinus rhythm Atrial premature complex Confirmed by Ernie Avena ([id number]) on 07/09/2021 11:35:06 AM  Radiology No results found.  Procedures Procedures   Medications Ordered in ED Medications - No data to display  ED Course  I have reviewed the triage vital signs and the nursing notes.  Pertinent labs & imaging results that were available during my care of the patient were reviewed by me and considered in my medical decision making (see chart for details).    MDM Rules/Calculators/A&P                          86 year old female with a history of Alzheimer's dementia at a memory care facility who presents to the emergency department after a fall.  The patient presents from [facility] memory care facility after an unwitnessed fall yesterday.  Unclear head trauma or loss of consciousness.  She has been holding her right wrist and in the complaining of some pain with some redness in the area of her wrist.  The patient was ambulatory after the event with no other clear signs of injury.  At her baseline mental status per her son who is bedside.  She had previously been given Xanax prior to transfer to the hospital but according to the patient's son she is at her baseline.   On arrival, the patient was GCS 14, ABC intact, at her baseline mental status for dementia per her son who was present bedside.  Patient presents with some erythema to the dorsum of her hand.  No warmth or tenderness to palpation to suggest cellulitis.  Findings more look like developing bruising/hematoma to the hand.  Will obtain x-ray imaging, CT of the head and cervical spine due to an unwitnessed fall based on the patient's age and reassess.   EKG was performed which revealed normal sinus  rhythm, no abnormal intervals, no ischemic changes noted.  CT of the head and cervical spine were without evidence of intracranial abnormality, fracture of the skull or cervical spine or dislocation.  X-ray imaging of the right wrist and right hand were without evidence of fracture.  The patient has no anatomic snuffbox tenderness with no concern for scaphoid fracture at this time.  Findings are most consistent with a contusion of the dorsum of the patient's right hand.  Laboratory work-up was generally unremarkable.  Given the patient's dementia, initially contacted [facility] to arrange for transport back to the patient's facility, however I was informed that transport times could be up to 8 hours due to lack of available trucks.  After discussion with the patient's son, he is comfortable transporting the patient back to her facility by POV.  I believe that this is safe and reasonable at this time.  Stable for discharge.  Final Clinical Impression(s) / ED Diagnoses Final diagnoses:  Fall, initial encounter  Hematoma  Contusion of right hand, initial encounter    Rx / DC Orders ED Discharge Orders     None        Ernie Avena, MD 07/11/21 1046

## 2021-07-09 NOTE — ED Triage Notes (Signed)
EMS were called by staff at [facility] d/t pt. "Holding her (right) wrist; and she fell yesterday. EMS states pt. Was ambulatory at scene. Her son is here and tells me that pt. Was given p.o. Xanax just prior to transport to hospital. Pt. Is drowsy and in no distress. Her post. Right wrist and finger #3 are slightly edematous and erythematous.

## 2021-07-21 DIAGNOSIS — F02C2 Dementia in other diseases classified elsewhere, severe, with psychotic disturbance: Secondary | ICD-10-CM | POA: Diagnosis not present

## 2021-07-21 DIAGNOSIS — G301 Alzheimer's disease with late onset: Secondary | ICD-10-CM | POA: Diagnosis not present

## 2021-07-21 DIAGNOSIS — F064 Anxiety disorder due to known physiological condition: Secondary | ICD-10-CM | POA: Diagnosis not present

## 2021-07-21 DIAGNOSIS — F331 Major depressive disorder, recurrent, moderate: Secondary | ICD-10-CM | POA: Diagnosis not present

## 2021-07-21 DIAGNOSIS — G4701 Insomnia due to medical condition: Secondary | ICD-10-CM | POA: Diagnosis not present

## 2021-07-24 DIAGNOSIS — R296 Repeated falls: Secondary | ICD-10-CM | POA: Diagnosis not present

## 2021-07-27 DIAGNOSIS — Z79899 Other long term (current) drug therapy: Secondary | ICD-10-CM | POA: Diagnosis not present

## 2021-07-27 DIAGNOSIS — M6281 Muscle weakness (generalized): Secondary | ICD-10-CM | POA: Diagnosis not present

## 2021-07-27 DIAGNOSIS — R2681 Unsteadiness on feet: Secondary | ICD-10-CM | POA: Diagnosis not present

## 2021-07-27 DIAGNOSIS — Z9181 History of falling: Secondary | ICD-10-CM | POA: Diagnosis not present

## 2021-07-27 DIAGNOSIS — R296 Repeated falls: Secondary | ICD-10-CM | POA: Diagnosis not present

## 2021-07-31 DIAGNOSIS — M6281 Muscle weakness (generalized): Secondary | ICD-10-CM | POA: Diagnosis not present

## 2021-07-31 DIAGNOSIS — Z9181 History of falling: Secondary | ICD-10-CM | POA: Diagnosis not present

## 2021-07-31 DIAGNOSIS — R2681 Unsteadiness on feet: Secondary | ICD-10-CM | POA: Diagnosis not present

## 2021-08-07 DIAGNOSIS — R2681 Unsteadiness on feet: Secondary | ICD-10-CM | POA: Diagnosis not present

## 2021-08-07 DIAGNOSIS — E559 Vitamin D deficiency, unspecified: Secondary | ICD-10-CM | POA: Diagnosis not present

## 2021-08-07 DIAGNOSIS — Z9181 History of falling: Secondary | ICD-10-CM | POA: Diagnosis not present

## 2021-08-07 DIAGNOSIS — K5901 Slow transit constipation: Secondary | ICD-10-CM | POA: Diagnosis not present

## 2021-08-07 DIAGNOSIS — M6281 Muscle weakness (generalized): Secondary | ICD-10-CM | POA: Diagnosis not present

## 2021-08-08 DIAGNOSIS — Z9181 History of falling: Secondary | ICD-10-CM | POA: Diagnosis not present

## 2021-08-08 DIAGNOSIS — R2681 Unsteadiness on feet: Secondary | ICD-10-CM | POA: Diagnosis not present

## 2021-08-08 DIAGNOSIS — M6281 Muscle weakness (generalized): Secondary | ICD-10-CM | POA: Diagnosis not present

## 2021-08-10 DIAGNOSIS — E559 Vitamin D deficiency, unspecified: Secondary | ICD-10-CM | POA: Diagnosis not present

## 2021-08-10 DIAGNOSIS — I10 Essential (primary) hypertension: Secondary | ICD-10-CM | POA: Diagnosis not present

## 2021-08-10 DIAGNOSIS — E782 Mixed hyperlipidemia: Secondary | ICD-10-CM | POA: Diagnosis not present

## 2021-08-10 DIAGNOSIS — D519 Vitamin B12 deficiency anemia, unspecified: Secondary | ICD-10-CM | POA: Diagnosis not present

## 2021-08-10 DIAGNOSIS — E039 Hypothyroidism, unspecified: Secondary | ICD-10-CM | POA: Diagnosis not present

## 2021-08-10 DIAGNOSIS — E119 Type 2 diabetes mellitus without complications: Secondary | ICD-10-CM | POA: Diagnosis not present

## 2021-08-11 DIAGNOSIS — R2681 Unsteadiness on feet: Secondary | ICD-10-CM | POA: Diagnosis not present

## 2021-08-11 DIAGNOSIS — M6281 Muscle weakness (generalized): Secondary | ICD-10-CM | POA: Diagnosis not present

## 2021-08-11 DIAGNOSIS — Z9181 History of falling: Secondary | ICD-10-CM | POA: Diagnosis not present

## 2021-08-16 DIAGNOSIS — M6281 Muscle weakness (generalized): Secondary | ICD-10-CM | POA: Diagnosis not present

## 2021-08-16 DIAGNOSIS — R2681 Unsteadiness on feet: Secondary | ICD-10-CM | POA: Diagnosis not present

## 2021-08-16 DIAGNOSIS — Z9181 History of falling: Secondary | ICD-10-CM | POA: Diagnosis not present

## 2021-08-21 DIAGNOSIS — M6281 Muscle weakness (generalized): Secondary | ICD-10-CM | POA: Diagnosis not present

## 2021-08-21 DIAGNOSIS — Z9181 History of falling: Secondary | ICD-10-CM | POA: Diagnosis not present

## 2021-08-21 DIAGNOSIS — R2681 Unsteadiness on feet: Secondary | ICD-10-CM | POA: Diagnosis not present

## 2021-08-22 DIAGNOSIS — M6281 Muscle weakness (generalized): Secondary | ICD-10-CM | POA: Diagnosis not present

## 2021-08-22 DIAGNOSIS — Z9181 History of falling: Secondary | ICD-10-CM | POA: Diagnosis not present

## 2021-08-22 DIAGNOSIS — R2681 Unsteadiness on feet: Secondary | ICD-10-CM | POA: Diagnosis not present

## 2021-08-24 DIAGNOSIS — Z9181 History of falling: Secondary | ICD-10-CM | POA: Diagnosis not present

## 2021-08-24 DIAGNOSIS — M6281 Muscle weakness (generalized): Secondary | ICD-10-CM | POA: Diagnosis not present

## 2021-08-24 DIAGNOSIS — R2681 Unsteadiness on feet: Secondary | ICD-10-CM | POA: Diagnosis not present

## 2021-09-01 DIAGNOSIS — F02C2 Dementia in other diseases classified elsewhere, severe, with psychotic disturbance: Secondary | ICD-10-CM | POA: Diagnosis not present

## 2021-09-01 DIAGNOSIS — F064 Anxiety disorder due to known physiological condition: Secondary | ICD-10-CM | POA: Diagnosis not present

## 2021-09-01 DIAGNOSIS — G4701 Insomnia due to medical condition: Secondary | ICD-10-CM | POA: Diagnosis not present

## 2021-09-01 DIAGNOSIS — F331 Major depressive disorder, recurrent, moderate: Secondary | ICD-10-CM | POA: Diagnosis not present

## 2021-09-01 DIAGNOSIS — G301 Alzheimer's disease with late onset: Secondary | ICD-10-CM | POA: Diagnosis not present

## 2021-09-04 DIAGNOSIS — M6281 Muscle weakness (generalized): Secondary | ICD-10-CM | POA: Diagnosis not present

## 2021-09-04 DIAGNOSIS — E559 Vitamin D deficiency, unspecified: Secondary | ICD-10-CM | POA: Diagnosis not present

## 2021-09-04 DIAGNOSIS — K5901 Slow transit constipation: Secondary | ICD-10-CM | POA: Diagnosis not present

## 2021-09-14 DIAGNOSIS — F064 Anxiety disorder due to known physiological condition: Secondary | ICD-10-CM | POA: Diagnosis not present

## 2021-09-21 DIAGNOSIS — I739 Peripheral vascular disease, unspecified: Secondary | ICD-10-CM | POA: Diagnosis not present

## 2021-09-21 DIAGNOSIS — B351 Tinea unguium: Secondary | ICD-10-CM | POA: Diagnosis not present

## 2021-09-27 DIAGNOSIS — E559 Vitamin D deficiency, unspecified: Secondary | ICD-10-CM | POA: Diagnosis not present

## 2021-09-27 DIAGNOSIS — M6281 Muscle weakness (generalized): Secondary | ICD-10-CM | POA: Diagnosis not present

## 2021-09-27 DIAGNOSIS — K5901 Slow transit constipation: Secondary | ICD-10-CM | POA: Diagnosis not present

## 2021-09-29 DIAGNOSIS — F331 Major depressive disorder, recurrent, moderate: Secondary | ICD-10-CM | POA: Diagnosis not present

## 2021-09-29 DIAGNOSIS — F064 Anxiety disorder due to known physiological condition: Secondary | ICD-10-CM | POA: Diagnosis not present

## 2021-09-29 DIAGNOSIS — G301 Alzheimer's disease with late onset: Secondary | ICD-10-CM | POA: Diagnosis not present

## 2021-09-29 DIAGNOSIS — F02C2 Dementia in other diseases classified elsewhere, severe, with psychotic disturbance: Secondary | ICD-10-CM | POA: Diagnosis not present

## 2021-09-29 DIAGNOSIS — G4701 Insomnia due to medical condition: Secondary | ICD-10-CM | POA: Diagnosis not present

## 2021-10-02 DIAGNOSIS — M6281 Muscle weakness (generalized): Secondary | ICD-10-CM | POA: Diagnosis not present

## 2021-10-02 DIAGNOSIS — K5901 Slow transit constipation: Secondary | ICD-10-CM | POA: Diagnosis not present

## 2021-10-02 DIAGNOSIS — E559 Vitamin D deficiency, unspecified: Secondary | ICD-10-CM | POA: Diagnosis not present

## 2021-10-09 DIAGNOSIS — R300 Dysuria: Secondary | ICD-10-CM | POA: Diagnosis not present

## 2021-10-11 DIAGNOSIS — N390 Urinary tract infection, site not specified: Secondary | ICD-10-CM | POA: Diagnosis not present

## 2021-10-12 DIAGNOSIS — N390 Urinary tract infection, site not specified: Secondary | ICD-10-CM | POA: Diagnosis not present

## 2021-10-13 DIAGNOSIS — F321 Major depressive disorder, single episode, moderate: Secondary | ICD-10-CM | POA: Diagnosis not present

## 2021-10-13 DIAGNOSIS — I739 Peripheral vascular disease, unspecified: Secondary | ICD-10-CM | POA: Diagnosis not present

## 2021-10-13 DIAGNOSIS — F064 Anxiety disorder due to known physiological condition: Secondary | ICD-10-CM | POA: Diagnosis not present

## 2021-10-30 DIAGNOSIS — E559 Vitamin D deficiency, unspecified: Secondary | ICD-10-CM | POA: Diagnosis not present

## 2021-10-30 DIAGNOSIS — M6281 Muscle weakness (generalized): Secondary | ICD-10-CM | POA: Diagnosis not present

## 2021-10-30 DIAGNOSIS — K5901 Slow transit constipation: Secondary | ICD-10-CM | POA: Diagnosis not present

## 2021-11-03 DIAGNOSIS — F064 Anxiety disorder due to known physiological condition: Secondary | ICD-10-CM | POA: Diagnosis not present

## 2021-11-03 DIAGNOSIS — F331 Major depressive disorder, recurrent, moderate: Secondary | ICD-10-CM | POA: Diagnosis not present

## 2021-11-10 DIAGNOSIS — F331 Major depressive disorder, recurrent, moderate: Secondary | ICD-10-CM | POA: Diagnosis not present

## 2021-11-10 DIAGNOSIS — F02C2 Dementia in other diseases classified elsewhere, severe, with psychotic disturbance: Secondary | ICD-10-CM | POA: Diagnosis not present

## 2021-11-10 DIAGNOSIS — F064 Anxiety disorder due to known physiological condition: Secondary | ICD-10-CM | POA: Diagnosis not present

## 2021-11-10 DIAGNOSIS — G301 Alzheimer's disease with late onset: Secondary | ICD-10-CM | POA: Diagnosis not present

## 2021-11-10 DIAGNOSIS — G4701 Insomnia due to medical condition: Secondary | ICD-10-CM | POA: Diagnosis not present

## 2021-11-13 DIAGNOSIS — R296 Repeated falls: Secondary | ICD-10-CM | POA: Diagnosis not present

## 2021-11-13 DIAGNOSIS — S51812A Laceration without foreign body of left forearm, initial encounter: Secondary | ICD-10-CM | POA: Diagnosis not present

## 2021-11-13 DIAGNOSIS — N390 Urinary tract infection, site not specified: Secondary | ICD-10-CM | POA: Diagnosis not present

## 2021-11-14 DIAGNOSIS — F064 Anxiety disorder due to known physiological condition: Secondary | ICD-10-CM | POA: Diagnosis not present

## 2021-11-15 DIAGNOSIS — G301 Alzheimer's disease with late onset: Secondary | ICD-10-CM | POA: Diagnosis not present

## 2021-11-19 DIAGNOSIS — F331 Major depressive disorder, recurrent, moderate: Secondary | ICD-10-CM | POA: Diagnosis not present

## 2021-11-19 DIAGNOSIS — F064 Anxiety disorder due to known physiological condition: Secondary | ICD-10-CM | POA: Diagnosis not present

## 2021-11-24 DIAGNOSIS — F331 Major depressive disorder, recurrent, moderate: Secondary | ICD-10-CM | POA: Diagnosis not present

## 2021-11-24 DIAGNOSIS — F064 Anxiety disorder due to known physiological condition: Secondary | ICD-10-CM | POA: Diagnosis not present

## 2021-11-24 DIAGNOSIS — F02C2 Dementia in other diseases classified elsewhere, severe, with psychotic disturbance: Secondary | ICD-10-CM | POA: Diagnosis not present

## 2021-11-24 DIAGNOSIS — G301 Alzheimer's disease with late onset: Secondary | ICD-10-CM | POA: Diagnosis not present

## 2021-11-24 DIAGNOSIS — G4701 Insomnia due to medical condition: Secondary | ICD-10-CM | POA: Diagnosis not present

## 2021-11-26 DIAGNOSIS — M79651 Pain in right thigh: Secondary | ICD-10-CM | POA: Diagnosis not present

## 2021-11-27 DIAGNOSIS — E559 Vitamin D deficiency, unspecified: Secondary | ICD-10-CM | POA: Diagnosis not present

## 2021-11-27 DIAGNOSIS — M545 Low back pain, unspecified: Secondary | ICD-10-CM | POA: Diagnosis not present

## 2021-11-27 DIAGNOSIS — M25551 Pain in right hip: Secondary | ICD-10-CM | POA: Diagnosis not present

## 2021-11-27 DIAGNOSIS — M25552 Pain in left hip: Secondary | ICD-10-CM | POA: Diagnosis not present

## 2021-11-27 DIAGNOSIS — K5901 Slow transit constipation: Secondary | ICD-10-CM | POA: Diagnosis not present

## 2021-11-27 DIAGNOSIS — M6281 Muscle weakness (generalized): Secondary | ICD-10-CM | POA: Diagnosis not present

## 2021-11-30 DIAGNOSIS — M6281 Muscle weakness (generalized): Secondary | ICD-10-CM | POA: Diagnosis not present

## 2021-11-30 DIAGNOSIS — R269 Unspecified abnormalities of gait and mobility: Secondary | ICD-10-CM | POA: Diagnosis not present

## 2021-11-30 DIAGNOSIS — Z9181 History of falling: Secondary | ICD-10-CM | POA: Diagnosis not present

## 2021-12-04 DIAGNOSIS — R2681 Unsteadiness on feet: Secondary | ICD-10-CM | POA: Diagnosis not present

## 2021-12-04 DIAGNOSIS — M6281 Muscle weakness (generalized): Secondary | ICD-10-CM | POA: Diagnosis not present

## 2021-12-04 DIAGNOSIS — Z9181 History of falling: Secondary | ICD-10-CM | POA: Diagnosis not present

## 2021-12-05 DIAGNOSIS — M6281 Muscle weakness (generalized): Secondary | ICD-10-CM | POA: Diagnosis not present

## 2021-12-05 DIAGNOSIS — Z9181 History of falling: Secondary | ICD-10-CM | POA: Diagnosis not present

## 2021-12-05 DIAGNOSIS — R269 Unspecified abnormalities of gait and mobility: Secondary | ICD-10-CM | POA: Diagnosis not present

## 2021-12-06 DIAGNOSIS — M6281 Muscle weakness (generalized): Secondary | ICD-10-CM | POA: Diagnosis not present

## 2021-12-06 DIAGNOSIS — Z9181 History of falling: Secondary | ICD-10-CM | POA: Diagnosis not present

## 2021-12-06 DIAGNOSIS — R2681 Unsteadiness on feet: Secondary | ICD-10-CM | POA: Diagnosis not present

## 2021-12-11 DIAGNOSIS — Z9181 History of falling: Secondary | ICD-10-CM | POA: Diagnosis not present

## 2021-12-11 DIAGNOSIS — M6281 Muscle weakness (generalized): Secondary | ICD-10-CM | POA: Diagnosis not present

## 2021-12-11 DIAGNOSIS — G301 Alzheimer's disease with late onset: Secondary | ICD-10-CM | POA: Diagnosis not present

## 2021-12-11 DIAGNOSIS — R296 Repeated falls: Secondary | ICD-10-CM | POA: Diagnosis not present

## 2021-12-11 DIAGNOSIS — R2681 Unsteadiness on feet: Secondary | ICD-10-CM | POA: Diagnosis not present

## 2021-12-11 DIAGNOSIS — F02C2 Dementia in other diseases classified elsewhere, severe, with psychotic disturbance: Secondary | ICD-10-CM | POA: Diagnosis not present

## 2021-12-12 DIAGNOSIS — R269 Unspecified abnormalities of gait and mobility: Secondary | ICD-10-CM | POA: Diagnosis not present

## 2021-12-12 DIAGNOSIS — M6281 Muscle weakness (generalized): Secondary | ICD-10-CM | POA: Diagnosis not present

## 2021-12-12 DIAGNOSIS — Z9181 History of falling: Secondary | ICD-10-CM | POA: Diagnosis not present

## 2021-12-13 DIAGNOSIS — I739 Peripheral vascular disease, unspecified: Secondary | ICD-10-CM | POA: Diagnosis not present

## 2021-12-13 DIAGNOSIS — F321 Major depressive disorder, single episode, moderate: Secondary | ICD-10-CM | POA: Diagnosis not present

## 2021-12-13 DIAGNOSIS — M6281 Muscle weakness (generalized): Secondary | ICD-10-CM | POA: Diagnosis not present

## 2021-12-13 DIAGNOSIS — R269 Unspecified abnormalities of gait and mobility: Secondary | ICD-10-CM | POA: Diagnosis not present

## 2021-12-13 DIAGNOSIS — Z9181 History of falling: Secondary | ICD-10-CM | POA: Diagnosis not present

## 2021-12-14 DIAGNOSIS — Z9181 History of falling: Secondary | ICD-10-CM | POA: Diagnosis not present

## 2021-12-14 DIAGNOSIS — M6281 Muscle weakness (generalized): Secondary | ICD-10-CM | POA: Diagnosis not present

## 2021-12-14 DIAGNOSIS — R269 Unspecified abnormalities of gait and mobility: Secondary | ICD-10-CM | POA: Diagnosis not present

## 2021-12-15 DIAGNOSIS — Z9181 History of falling: Secondary | ICD-10-CM | POA: Diagnosis not present

## 2021-12-15 DIAGNOSIS — F331 Major depressive disorder, recurrent, moderate: Secondary | ICD-10-CM | POA: Diagnosis not present

## 2021-12-15 DIAGNOSIS — M6281 Muscle weakness (generalized): Secondary | ICD-10-CM | POA: Diagnosis not present

## 2021-12-15 DIAGNOSIS — F419 Anxiety disorder, unspecified: Secondary | ICD-10-CM | POA: Diagnosis not present

## 2021-12-15 DIAGNOSIS — R2681 Unsteadiness on feet: Secondary | ICD-10-CM | POA: Diagnosis not present

## 2021-12-18 DIAGNOSIS — M6281 Muscle weakness (generalized): Secondary | ICD-10-CM | POA: Diagnosis not present

## 2021-12-18 DIAGNOSIS — Z9181 History of falling: Secondary | ICD-10-CM | POA: Diagnosis not present

## 2021-12-18 DIAGNOSIS — R269 Unspecified abnormalities of gait and mobility: Secondary | ICD-10-CM | POA: Diagnosis not present

## 2021-12-20 DIAGNOSIS — M6281 Muscle weakness (generalized): Secondary | ICD-10-CM | POA: Diagnosis not present

## 2021-12-20 DIAGNOSIS — Z9181 History of falling: Secondary | ICD-10-CM | POA: Diagnosis not present

## 2021-12-20 DIAGNOSIS — R2681 Unsteadiness on feet: Secondary | ICD-10-CM | POA: Diagnosis not present

## 2021-12-21 DIAGNOSIS — R269 Unspecified abnormalities of gait and mobility: Secondary | ICD-10-CM | POA: Diagnosis not present

## 2021-12-21 DIAGNOSIS — M6281 Muscle weakness (generalized): Secondary | ICD-10-CM | POA: Diagnosis not present

## 2021-12-21 DIAGNOSIS — Z9181 History of falling: Secondary | ICD-10-CM | POA: Diagnosis not present

## 2021-12-22 DIAGNOSIS — F331 Major depressive disorder, recurrent, moderate: Secondary | ICD-10-CM | POA: Diagnosis not present

## 2021-12-22 DIAGNOSIS — G4701 Insomnia due to medical condition: Secondary | ICD-10-CM | POA: Diagnosis not present

## 2021-12-22 DIAGNOSIS — M6281 Muscle weakness (generalized): Secondary | ICD-10-CM | POA: Diagnosis not present

## 2021-12-22 DIAGNOSIS — Z9181 History of falling: Secondary | ICD-10-CM | POA: Diagnosis not present

## 2021-12-22 DIAGNOSIS — F02C2 Dementia in other diseases classified elsewhere, severe, with psychotic disturbance: Secondary | ICD-10-CM | POA: Diagnosis not present

## 2021-12-22 DIAGNOSIS — F064 Anxiety disorder due to known physiological condition: Secondary | ICD-10-CM | POA: Diagnosis not present

## 2021-12-22 DIAGNOSIS — G301 Alzheimer's disease with late onset: Secondary | ICD-10-CM | POA: Diagnosis not present

## 2021-12-22 DIAGNOSIS — R2681 Unsteadiness on feet: Secondary | ICD-10-CM | POA: Diagnosis not present

## 2021-12-25 DIAGNOSIS — G301 Alzheimer's disease with late onset: Secondary | ICD-10-CM | POA: Diagnosis not present

## 2021-12-25 DIAGNOSIS — M6281 Muscle weakness (generalized): Secondary | ICD-10-CM | POA: Diagnosis not present

## 2021-12-25 DIAGNOSIS — F064 Anxiety disorder due to known physiological condition: Secondary | ICD-10-CM | POA: Diagnosis not present

## 2021-12-25 DIAGNOSIS — K5901 Slow transit constipation: Secondary | ICD-10-CM | POA: Diagnosis not present

## 2021-12-26 DIAGNOSIS — Z9181 History of falling: Secondary | ICD-10-CM | POA: Diagnosis not present

## 2021-12-26 DIAGNOSIS — R269 Unspecified abnormalities of gait and mobility: Secondary | ICD-10-CM | POA: Diagnosis not present

## 2021-12-26 DIAGNOSIS — M6281 Muscle weakness (generalized): Secondary | ICD-10-CM | POA: Diagnosis not present

## 2021-12-27 DIAGNOSIS — M6281 Muscle weakness (generalized): Secondary | ICD-10-CM | POA: Diagnosis not present

## 2021-12-27 DIAGNOSIS — Z9181 History of falling: Secondary | ICD-10-CM | POA: Diagnosis not present

## 2021-12-27 DIAGNOSIS — R2681 Unsteadiness on feet: Secondary | ICD-10-CM | POA: Diagnosis not present

## 2021-12-28 DIAGNOSIS — Z9181 History of falling: Secondary | ICD-10-CM | POA: Diagnosis not present

## 2021-12-28 DIAGNOSIS — M6281 Muscle weakness (generalized): Secondary | ICD-10-CM | POA: Diagnosis not present

## 2021-12-28 DIAGNOSIS — R269 Unspecified abnormalities of gait and mobility: Secondary | ICD-10-CM | POA: Diagnosis not present

## 2021-12-29 DIAGNOSIS — R2681 Unsteadiness on feet: Secondary | ICD-10-CM | POA: Diagnosis not present

## 2021-12-29 DIAGNOSIS — Z9181 History of falling: Secondary | ICD-10-CM | POA: Diagnosis not present

## 2021-12-29 DIAGNOSIS — M6281 Muscle weakness (generalized): Secondary | ICD-10-CM | POA: Diagnosis not present

## 2022-01-01 DIAGNOSIS — M6281 Muscle weakness (generalized): Secondary | ICD-10-CM | POA: Diagnosis not present

## 2022-01-01 DIAGNOSIS — Z9181 History of falling: Secondary | ICD-10-CM | POA: Diagnosis not present

## 2022-01-01 DIAGNOSIS — R269 Unspecified abnormalities of gait and mobility: Secondary | ICD-10-CM | POA: Diagnosis not present

## 2022-01-03 DIAGNOSIS — M6281 Muscle weakness (generalized): Secondary | ICD-10-CM | POA: Diagnosis not present

## 2022-01-03 DIAGNOSIS — R2681 Unsteadiness on feet: Secondary | ICD-10-CM | POA: Diagnosis not present

## 2022-01-03 DIAGNOSIS — Z9181 History of falling: Secondary | ICD-10-CM | POA: Diagnosis not present

## 2022-01-04 DIAGNOSIS — M6281 Muscle weakness (generalized): Secondary | ICD-10-CM | POA: Diagnosis not present

## 2022-01-04 DIAGNOSIS — R269 Unspecified abnormalities of gait and mobility: Secondary | ICD-10-CM | POA: Diagnosis not present

## 2022-01-04 DIAGNOSIS — Z9181 History of falling: Secondary | ICD-10-CM | POA: Diagnosis not present

## 2022-01-05 DIAGNOSIS — G301 Alzheimer's disease with late onset: Secondary | ICD-10-CM | POA: Diagnosis not present

## 2022-01-05 DIAGNOSIS — F064 Anxiety disorder due to known physiological condition: Secondary | ICD-10-CM | POA: Diagnosis not present

## 2022-01-05 DIAGNOSIS — M6281 Muscle weakness (generalized): Secondary | ICD-10-CM | POA: Diagnosis not present

## 2022-01-05 DIAGNOSIS — F331 Major depressive disorder, recurrent, moderate: Secondary | ICD-10-CM | POA: Diagnosis not present

## 2022-01-05 DIAGNOSIS — Z9181 History of falling: Secondary | ICD-10-CM | POA: Diagnosis not present

## 2022-01-05 DIAGNOSIS — R2681 Unsteadiness on feet: Secondary | ICD-10-CM | POA: Diagnosis not present

## 2022-01-05 DIAGNOSIS — F02C2 Dementia in other diseases classified elsewhere, severe, with psychotic disturbance: Secondary | ICD-10-CM | POA: Diagnosis not present

## 2022-01-08 DIAGNOSIS — Z9181 History of falling: Secondary | ICD-10-CM | POA: Diagnosis not present

## 2022-01-08 DIAGNOSIS — R296 Repeated falls: Secondary | ICD-10-CM | POA: Diagnosis not present

## 2022-01-08 DIAGNOSIS — M6281 Muscle weakness (generalized): Secondary | ICD-10-CM | POA: Diagnosis not present

## 2022-01-08 DIAGNOSIS — R269 Unspecified abnormalities of gait and mobility: Secondary | ICD-10-CM | POA: Diagnosis not present

## 2022-01-10 ENCOUNTER — Emergency Department (HOSPITAL_BASED_OUTPATIENT_CLINIC_OR_DEPARTMENT_OTHER): Payer: PPO | Admitting: Radiology

## 2022-01-10 ENCOUNTER — Encounter (HOSPITAL_BASED_OUTPATIENT_CLINIC_OR_DEPARTMENT_OTHER): Payer: Self-pay

## 2022-01-10 ENCOUNTER — Other Ambulatory Visit: Payer: Self-pay

## 2022-01-10 ENCOUNTER — Emergency Department (HOSPITAL_BASED_OUTPATIENT_CLINIC_OR_DEPARTMENT_OTHER)
Admission: EM | Admit: 2022-01-10 | Discharge: 2022-01-10 | Disposition: A | Discharge status: Skilled Nursing Facility | Payer: PPO | Attending: Emergency Medicine | Admitting: Emergency Medicine

## 2022-01-10 ENCOUNTER — Emergency Department (HOSPITAL_BASED_OUTPATIENT_CLINIC_OR_DEPARTMENT_OTHER): Payer: PPO

## 2022-01-10 DIAGNOSIS — S0990XA Unspecified injury of head, initial encounter: Secondary | ICD-10-CM | POA: Diagnosis not present

## 2022-01-10 DIAGNOSIS — F0390 Unspecified dementia without behavioral disturbance: Secondary | ICD-10-CM | POA: Insufficient documentation

## 2022-01-10 DIAGNOSIS — S199XXA Unspecified injury of neck, initial encounter: Secondary | ICD-10-CM | POA: Diagnosis not present

## 2022-01-10 DIAGNOSIS — Z9181 History of falling: Secondary | ICD-10-CM | POA: Diagnosis not present

## 2022-01-10 DIAGNOSIS — W19XXXA Unspecified fall, initial encounter: Secondary | ICD-10-CM | POA: Diagnosis not present

## 2022-01-10 DIAGNOSIS — I739 Peripheral vascular disease, unspecified: Secondary | ICD-10-CM | POA: Diagnosis not present

## 2022-01-10 DIAGNOSIS — R079 Chest pain, unspecified: Secondary | ICD-10-CM | POA: Diagnosis not present

## 2022-01-10 DIAGNOSIS — F321 Major depressive disorder, single episode, moderate: Secondary | ICD-10-CM | POA: Diagnosis not present

## 2022-01-10 DIAGNOSIS — S0083XA Contusion of other part of head, initial encounter: Secondary | ICD-10-CM | POA: Diagnosis not present

## 2022-01-10 DIAGNOSIS — R2681 Unsteadiness on feet: Secondary | ICD-10-CM | POA: Diagnosis not present

## 2022-01-10 DIAGNOSIS — M6281 Muscle weakness (generalized): Secondary | ICD-10-CM | POA: Diagnosis not present

## 2022-01-10 DIAGNOSIS — R102 Pelvic and perineal pain: Secondary | ICD-10-CM | POA: Diagnosis not present

## 2022-01-10 MED ORDER — ALPRAZOLAM 0.5 MG PO TABS
0.2500 mg | ORAL_TABLET | Freq: Once | ORAL | Status: AC
Start: 2022-01-10 — End: 2022-01-10
  Administered 2022-01-10: 0.25 mg via ORAL
  Filled 2022-01-10: qty 1

## 2022-01-10 NOTE — ED Notes (Signed)
[facility] called for transportation back to [facility]. Currently #7 on the list

## 2022-01-10 NOTE — Discharge Instructions (Signed)
Please follow-up with your primary care doctor as needed.  Come back to ER as needed for any new or concerning symptoms.

## 2022-01-10 NOTE — ED Triage Notes (Addendum)
Brought via ems after fall.  Report patient has dementia.  Patient poor historian.  Denies pain.  Old bruise noted to left eye brow.  Arrived with c-collar.  Alert but confusion  Reported at baseline.  Unwitnessed fall.  From [facility] Facility

## 2022-01-10 NOTE — ED Provider Notes (Addendum)
[facility] EMERGENCY DEPT Provider Note   CSN: [id number] Arrival date & time: 01/10/22  1709     History  Chief Complaint  Patient presents with   Marletta Lor    NATTALIE SANTIESTEBAN is a 87 y.o. female.  Presents emerged department due to concern for fall.  The caregiver at bedside and family were at bedside did not witness the fall but per the facility staff patient did not pass out that they are aware of.  Patient is in memory care, has advanced dementia.  She is acting at baseline.  She has not had any complaints of pain.  They have not appreciated any trauma.  She did have a fall a little while ago and has a bruise to her left eyebrow from the fall but this happened at least a few days ago.  Not on blood thinners.  HPI     Home Medications Prior to Admission medications   Medication Sig Start Date End Date Taking? Authorizing Provider  ALPRAZolam (XANAX) 0.25 MG tablet Take 1 tablet (0.25 mg total) by mouth 2 (two) times daily as needed for anxiety. 09/10/20   Almon Hercules, MD  Cholecalciferol (VITAMIN D-3) 125 MCG (5000 UT) TABS Take 1 tablet by mouth daily. Patient taking differently: Take 5,000 Units by mouth daily. 10/06/19   Hilts, Casimiro Needle, MD  divalproex (DEPAKOTE) 125 MG DR tablet Take 1 tablet (125 mg total) by mouth daily. 09/10/20   Almon Hercules, MD  escitalopram (LEXAPRO) 20 MG tablet Take 1 tablet (20 mg total) by mouth daily. 09/10/20   Almon Hercules, MD  Melatonin 5 MG CAPS Take 1 capsule (5 mg total) by mouth at bedtime. 09/10/20   Almon Hercules, MD  memantine (NAMENDA) 10 MG tablet Take 1 tablet (10 mg total) by mouth 2 (two) times daily. 05/13/20   Van Clines, MD  Menatetrenone (VITAMIN K2) 100 MCG TABS Take 100 mcg by mouth daily. 10/06/19   Hilts, Casimiro Needle, MD  raloxifene (EVISTA) 60 MG tablet Take 60 mg by mouth once daily Patient taking differently: Take 60 mg by mouth daily. 11/02/19   Mechele Claude, MD  traZODone (DESYREL) 50 MG tablet Take 1 tablet (50  mg total) by mouth at bedtime. 09/10/20   Almon Hercules, MD      Allergies    Levaquin [levofloxacin in d5w]    Review of Systems   Review of Systems  Unable to perform ROS: Dementia    Physical Exam Updated Vital Signs BP (!) 142/85 (BP Location: Right Arm)   Pulse 77   Temp 98 F (36.7 C) (Oral)   Resp 18   Ht 5' 5.5" (1.664 m)   SpO2 100%   BMI 20.23 kg/m  Physical Exam Vitals and nursing note reviewed.  Constitutional:      General: She is not in acute distress.    Appearance: She is well-developed.  HENT:     Head: Normocephalic.     Comments: Slight ecchymosis to left forehead Eyes:     Conjunctiva/sclera: Conjunctivae normal.  Cardiovascular:     Rate and Rhythm: Normal rate and regular rhythm.     Heart sounds: No murmur heard. Pulmonary:     Effort: Pulmonary effort is normal. No respiratory distress.     Breath sounds: Normal breath sounds.  Abdominal:     Palpations: Abdomen is soft.     Tenderness: There is no abdominal tenderness.  Musculoskeletal:        General:  No swelling.     Cervical back: Neck supple.     Comments: Back: no C, T, L spine TTP, no step off or deformity RUE: no TTP throughout, no deformity, normal joint ROM, radial pulse intact, distal sensation and motor intact LUE: no TTP throughout, no deformity, normal joint ROM, radial pulse intact, distal sensation and motor intact RLE:  no TTP throughout, no deformity, normal joint ROM, distal pulse, sensation and motor intact LLE: no TTP throughout, no deformity, normal joint ROM, distal pulse, sensation and motor intact  Skin:    General: Skin is warm and dry.     Capillary Refill: Capillary refill takes less than 2 seconds.  Neurological:     General: No focal deficit present.     Mental Status: She is alert.     Comments: Alert, baseline dementia  Psychiatric:        Mood and Affect: Mood normal.     ED Results / Procedures / Treatments   Labs (all labs ordered are listed, but  only abnormal results are displayed) Labs Reviewed - No data to display  EKG None  Radiology DG Pelvis 1-2 Views  Result Date: 01/10/2022 CLINICAL DATA:  Fall, pelvic pain EXAM: PELVIS - 1-2 VIEW COMPARISON:  None Available. FINDINGS: There is no evidence of pelvic fracture or diastasis. No pelvic bone lesions are seen. Hip joints and SI joints symmetric. IMPRESSION: Negative. Electronically Signed   By: Charlett Nose M.D.   On: 01/10/2022 19:28   DG Chest 1 View  Result Date: 01/10/2022 CLINICAL DATA:  Fall, chest pain EXAM: CHEST  1 VIEW COMPARISON:  09/09/2020 FINDINGS: Heart and mediastinal contours are within normal limits. No focal opacities or effusions. No acute bony abnormality. No pneumothorax. IMPRESSION: No active disease. Electronically Signed   By: Charlett Nose M.D.   On: 01/10/2022 19:28   CT Cervical Spine Wo Contrast  Result Date: 01/10/2022 CLINICAL DATA:  Neck trauma (Age >= 65y) EXAM: CT CERVICAL SPINE WITHOUT CONTRAST TECHNIQUE: Multidetector CT imaging of the cervical spine was performed without intravenous contrast. Multiplanar CT image reconstructions were also generated. RADIATION DOSE REDUCTION: This exam was performed according to the departmental dose-optimization program which includes automated exposure control, adjustment of the mA and/or kV according to patient size and/or use of iterative reconstruction technique. COMPARISON:  None Available. FINDINGS: Alignment: Normal Skull base and vertebrae: No acute fracture. No primary bone lesion or focal pathologic process. Soft tissues and spinal canal: No prevertebral fluid or swelling. No visible canal hematoma. Disc levels: Disc space narrowing at C5-6 and C6-7. Mild bilateral degenerative facet disease, left greater than right. No visible disc herniation. Upper chest: No acute findings Other: None IMPRESSION: Degenerative changes.  No acute bony abnormality. Electronically Signed   By: Charlett Nose M.D.   On: 01/10/2022  19:20   CT Head Wo Contrast  Result Date: 01/10/2022 CLINICAL DATA:  Fall.  Head trauma, moderate-severe EXAM: CT HEAD WITHOUT CONTRAST TECHNIQUE: Contiguous axial images were obtained from the base of the skull through the vertex without intravenous contrast. RADIATION DOSE REDUCTION: This exam was performed according to the departmental dose-optimization program which includes automated exposure control, adjustment of the mA and/or kV according to patient size and/or use of iterative reconstruction technique. COMPARISON:  07/09/2021 FINDINGS: Brain: There is atrophy and chronic small vessel disease changes. No acute intracranial abnormality. Specifically, no hemorrhage, hydrocephalus, mass lesion, acute infarction, or significant intracranial injury. Vascular: No hyperdense vessel or unexpected calcification. Skull: No acute  calvarial abnormality. Sinuses/Orbits: No acute findings Other: None IMPRESSION: Atrophy, chronic microvascular disease. No acute intracranial abnormality. Electronically Signed   By: Charlett Nose M.D.   On: 01/10/2022 19:19    Procedures Procedures    Medications Ordered in ED Medications  ALPRAZolam [name]) tablet 0.25 mg (0.25 mg Oral Given 01/10/22 1958)    ED Course/ Medical Decision Making/ A&P                           Medical Decision Making Amount and/or Complexity of Data Reviewed Radiology: ordered.  Risk Prescription drug management.   87 year old lady with dementia presenting to ER from nursing home memory care unit with concern for fall.  She had a small bruise to her left forehead from old fall but no new trauma appreciated.  Check CT head, C-spine, chest and pelvis.  Independently reviewed and interpreted CT and XR images.  No acute findings to suggest acute trauma.  No fracture or dislocation.  Patient was provided dose of Xanax which per family she routinely takes at this time of day.  Discharged back to her memory care unit. Vitals stable.           Final Clinical Impression(s) / ED Diagnoses Final diagnoses:  Fall, initial encounter  Dementia without behavioral disturbance, psychotic disturbance, mood disturbance, or anxiety, unspecified dementia severity, unspecified dementia type [facility])    Rx / DC Orders ED Discharge Orders     None         Milagros Loll, MD 01/10/22 2036    Milagros Loll, MD 01/10/22 2036

## 2022-01-10 NOTE — ED Notes (Signed)
Dtg has called and given permission for sitter to drive resident back to facility PTAR cancelled

## 2022-01-10 NOTE — ED Notes (Signed)
Called [facility] and no answer Dc instructions reviewed with Lawson [employer]cal Curator) for pt that will relay to staff

## 2022-01-11 DIAGNOSIS — Z9181 History of falling: Secondary | ICD-10-CM | POA: Diagnosis not present

## 2022-01-11 DIAGNOSIS — R269 Unspecified abnormalities of gait and mobility: Secondary | ICD-10-CM | POA: Diagnosis not present

## 2022-01-11 DIAGNOSIS — M6281 Muscle weakness (generalized): Secondary | ICD-10-CM | POA: Diagnosis not present

## 2022-01-12 DIAGNOSIS — Z9181 History of falling: Secondary | ICD-10-CM | POA: Diagnosis not present

## 2022-01-12 DIAGNOSIS — R2681 Unsteadiness on feet: Secondary | ICD-10-CM | POA: Diagnosis not present

## 2022-01-12 DIAGNOSIS — M6281 Muscle weakness (generalized): Secondary | ICD-10-CM | POA: Diagnosis not present

## 2022-01-15 DIAGNOSIS — R296 Repeated falls: Secondary | ICD-10-CM | POA: Diagnosis not present

## 2022-02-02 ENCOUNTER — Emergency Department (HOSPITAL_COMMUNITY): Payer: PPO

## 2022-02-02 ENCOUNTER — Emergency Department (HOSPITAL_COMMUNITY)
Admission: EM | Admit: 2022-02-02 | Discharge: 2022-02-02 | Disposition: A | Discharge status: Skilled Nursing Facility | Payer: PPO | Attending: Emergency Medicine | Admitting: Emergency Medicine

## 2022-02-02 ENCOUNTER — Other Ambulatory Visit: Payer: Self-pay

## 2022-02-02 ENCOUNTER — Encounter (HOSPITAL_COMMUNITY): Payer: Self-pay | Admitting: Emergency Medicine

## 2022-02-02 DIAGNOSIS — Z981 Arthrodesis status: Secondary | ICD-10-CM | POA: Diagnosis not present

## 2022-02-02 DIAGNOSIS — R531 Weakness: Secondary | ICD-10-CM | POA: Diagnosis not present

## 2022-02-02 DIAGNOSIS — W07XXXA Fall from chair, initial encounter: Secondary | ICD-10-CM | POA: Insufficient documentation

## 2022-02-02 DIAGNOSIS — G309 Alzheimer's disease, unspecified: Secondary | ICD-10-CM | POA: Insufficient documentation

## 2022-02-02 DIAGNOSIS — Z043 Encounter for examination and observation following other accident: Secondary | ICD-10-CM | POA: Diagnosis not present

## 2022-02-02 DIAGNOSIS — S199XXA Unspecified injury of neck, initial encounter: Secondary | ICD-10-CM | POA: Diagnosis not present

## 2022-02-02 DIAGNOSIS — S0990XA Unspecified injury of head, initial encounter: Secondary | ICD-10-CM | POA: Diagnosis not present

## 2022-02-02 DIAGNOSIS — S0101XA Laceration without foreign body of scalp, initial encounter: Secondary | ICD-10-CM | POA: Insufficient documentation

## 2022-02-02 DIAGNOSIS — Y92129 Unspecified place in nursing home as the place of occurrence of the external cause: Secondary | ICD-10-CM | POA: Insufficient documentation

## 2022-02-02 DIAGNOSIS — W19XXXA Unspecified fall, initial encounter: Secondary | ICD-10-CM

## 2022-02-02 DIAGNOSIS — F0390 Unspecified dementia without behavioral disturbance: Secondary | ICD-10-CM | POA: Diagnosis not present

## 2022-02-02 DIAGNOSIS — M4312 Spondylolisthesis, cervical region: Secondary | ICD-10-CM | POA: Diagnosis not present

## 2022-02-02 DIAGNOSIS — R58 Hemorrhage, not elsewhere classified: Secondary | ICD-10-CM | POA: Diagnosis not present

## 2022-02-02 DIAGNOSIS — M4322 Fusion of spine, cervical region: Secondary | ICD-10-CM | POA: Diagnosis not present

## 2022-02-02 DIAGNOSIS — I959 Hypotension, unspecified: Secondary | ICD-10-CM | POA: Diagnosis not present

## 2022-02-02 DIAGNOSIS — J984 Other disorders of lung: Secondary | ICD-10-CM | POA: Diagnosis not present

## 2022-02-02 MED ORDER — LIDOCAINE-EPINEPHRINE-TETRACAINE (LET) TOPICAL GEL
3.0000 mL | Freq: Once | TOPICAL | Status: AC
Start: 2022-02-02 — End: 2022-02-02
  Administered 2022-02-02: 3 mL via TOPICAL
  Filled 2022-02-02: qty 3

## 2022-02-02 NOTE — Discharge Instructions (Signed)
Staple removal in 5 to 7 days.

## 2022-02-02 NOTE — ED Triage Notes (Signed)
Pt to ER via EMS from [facility]. Pt was attempting to sit in a chair when she missed and fell hitting head on a table.  Pt with small laceration to back of head bleeding controlled.  PT has dementia and per staff is at her baseline.  Pt had been given clonazepam prior to a fall.

## 2022-02-02 NOTE — ED Provider Notes (Signed)
[facility]-EMERGENCY DEPT Provider Note   CSN: [id number] Arrival date & time: 02/02/22  1700     History  Chief Complaint  Patient presents with   Jaclyn Robertson is a 87 y.o. female.  87 year old female brought in by EMS from nursing facility after a fall.  Patient was at the dining facility in her nursing home, went to sit down on a chair when she missed the chair and fell to the ground hitting the back of her head on the table resulting in a small laceration to the back of her head.  EMS reports patient to be at baseline mental status per his nursing facility.  Past medical history is significant for Alzheimer's, dementia.       Home Medications Prior to Admission medications   Medication Sig Start Date End Date Taking? Authorizing Provider  ALPRAZolam (XANAX) 0.25 MG tablet Take 1 tablet (0.25 mg total) by mouth 2 (two) times daily as needed for anxiety. 09/10/20   Almon Hercules, MD  Cholecalciferol (VITAMIN D-3) 125 MCG (5000 UT) TABS Take 1 tablet by mouth daily. Patient taking differently: Take 5,000 Units by mouth daily. 10/06/19   Hilts, Casimiro Needle, MD  divalproex (DEPAKOTE) 125 MG DR tablet Take 1 tablet (125 mg total) by mouth daily. 09/10/20   Almon Hercules, MD  escitalopram (LEXAPRO) 20 MG tablet Take 1 tablet (20 mg total) by mouth daily. 09/10/20   Almon Hercules, MD  Melatonin 5 MG CAPS Take 1 capsule (5 mg total) by mouth at bedtime. 09/10/20   Almon Hercules, MD  memantine (NAMENDA) 10 MG tablet Take 1 tablet (10 mg total) by mouth 2 (two) times daily. 05/13/20   Van Clines, MD  Menatetrenone (VITAMIN K2) 100 MCG TABS Take 100 mcg by mouth daily. 10/06/19   Hilts, Casimiro Needle, MD  raloxifene (EVISTA) 60 MG tablet Take 60 mg by mouth once daily Patient taking differently: Take 60 mg by mouth daily. 11/02/19   Mechele Claude, MD  traZODone (DESYREL) 50 MG tablet Take 1 tablet (50 mg total) by mouth at bedtime. 09/10/20   Almon Hercules, MD       Allergies    Levaquin [levofloxacin in d5w]    Review of Systems   Review of Systems Level 5 caveat for dementia/Alzheimer Physical Exam Updated Vital Signs BP 102/90 (BP Location: Left Arm)   Pulse (!) 58   Temp 97.6 F (36.4 C) (Oral)   Resp 18   Ht 5' 5.5" (1.664 m)   Wt 56.7 kg   SpO2 97%   BMI 20.48 kg/m  Physical Exam Vitals and nursing note reviewed.  Constitutional:      General: She is not in acute distress.    Appearance: She is well-developed. She is not diaphoretic.    HENT:     Head: Normocephalic.     Nose: Nose normal.     Mouth/Throat:     Mouth: Mucous membranes are moist.  Eyes:     Extraocular Movements: Extraocular movements intact.     Pupils: Pupils are equal, round, and reactive to light.  Cardiovascular:     Pulses: Normal pulses.  Pulmonary:     Effort: Pulmonary effort is normal.  Abdominal:     Palpations: Abdomen is soft.     Tenderness: There is no abdominal tenderness.  Musculoskeletal:        General: No swelling, tenderness, deformity or signs of injury. Normal range of  motion.     Cervical back: Normal range of motion and neck supple. No tenderness.     Right lower leg: No edema.     Left lower leg: No edema.  Skin:    General: Skin is warm and dry.     Findings: No erythema or rash.  Neurological:     Mental Status: She is alert and oriented to person, place, and time.  Psychiatric:        Behavior: Behavior normal.     ED Results / Procedures / Treatments   Labs (all labs ordered are listed, but only abnormal results are displayed) Labs Reviewed - No data to display  EKG None  Radiology DG Pelvis 1-2 Views  Result Date: 02/02/2022 CLINICAL DATA:  Patient fell while sitting.  Dementia. EXAM: PELVIS - 1-2 VIEW COMPARISON:  01/10/2022 FINDINGS: Pelvis and visualized hips appear intact. No evidence of acute fracture or displacement. Degenerative changes demonstrated in the lower lumbar spine and hips. Soft tissues  are unremarkable. IMPRESSION: No acute displaced fractures identified in the pelvis. Electronically Signed   By: Burman Nieves M.D.   On: 02/02/2022 18:27   CT Head Wo Contrast  Result Date: 02/02/2022 CLINICAL DATA:  Head trauma, minor (Age >= 65y); Neck trauma (Age >= 65y) EXAM: CT HEAD WITHOUT CONTRAST CT CERVICAL SPINE WITHOUT CONTRAST TECHNIQUE: Multidetector CT imaging of the head and cervical spine was performed following the standard protocol without intravenous contrast. Multiplanar CT image reconstructions of the cervical spine were also generated. RADIATION DOSE REDUCTION: This exam was performed according to the departmental dose-optimization program which includes automated exposure control, adjustment of the mA and/or kV according to patient size and/or use of iterative reconstruction technique. COMPARISON:  None Available. CONTRAST:  None. FINDINGS: CT HEAD FINDINGS Brain: Stable cerebral ventricle sizes are concordant with the degree of cerebral volume loss. Patchy and confluent areas of decreased attenuation are noted throughout the deep and periventricular white matter of the cerebral hemispheres bilaterally, compatible with chronic microvascular ischemic disease. No evidence of large-territorial acute infarction. No parenchymal hemorrhage. No mass lesion. No extra-axial collection. No mass effect or midline shift. No hydrocephalus. Basilar cisterns are patent. Vascular: No hyperdense vessel. Skull: No acute fracture or focal lesion. Sinuses/Orbits: Paranasal sinuses and mastoid air cells are clear. Bilateral lens replacement. Otherwise orbits are unremarkable. Other: No large scalp hematoma. CT CERVICAL SPINE FINDINGS Alignment: Stable grade 1 anterolisthesis of C3 on C4 and mild retrolisthesis of C4 on C5. Skull base and vertebrae: Multilevel moderate degenerative changes of the spine. Redemonstration of right facet joint fusion at the C2 through C4 levels. No acute fracture. No aggressive  appearing focal osseous lesion or focal pathologic process. Soft tissues and spinal canal: No prevertebral fluid or swelling. No visible canal hematoma. Upper chest: Biapical pleural/pulmonary scarring. Other: 1 cm right thyroid gland hypodense lesion. Not clinically significant; no follow-up imaging recommended (ref: J Am Coll Radiol. 2015 Feb;12(2): 143-50). IMPRESSION: 1. No acute intracranial abnormality. 2. No acute displaced fracture or traumatic listhesis of the cervical spine. Electronically Signed   By: Tish Frederickson M.D.   On: 02/02/2022 18:24   CT Cervical Spine Wo Contrast  Result Date: 02/02/2022 CLINICAL DATA:  Head trauma, minor (Age >= 65y); Neck trauma (Age >= 65y) EXAM: CT HEAD WITHOUT CONTRAST CT CERVICAL SPINE WITHOUT CONTRAST TECHNIQUE: Multidetector CT imaging of the head and cervical spine was performed following the standard protocol without intravenous contrast. Multiplanar CT image reconstructions of the cervical spine were  also generated. RADIATION DOSE REDUCTION: This exam was performed according to the departmental dose-optimization program which includes automated exposure control, adjustment of the mA and/or kV according to patient size and/or use of iterative reconstruction technique. COMPARISON:  None Available. CONTRAST:  None. FINDINGS: CT HEAD FINDINGS Brain: Stable cerebral ventricle sizes are concordant with the degree of cerebral volume loss. Patchy and confluent areas of decreased attenuation are noted throughout the deep and periventricular white matter of the cerebral hemispheres bilaterally, compatible with chronic microvascular ischemic disease. No evidence of large-territorial acute infarction. No parenchymal hemorrhage. No mass lesion. No extra-axial collection. No mass effect or midline shift. No hydrocephalus. Basilar cisterns are patent. Vascular: No hyperdense vessel. Skull: No acute fracture or focal lesion. Sinuses/Orbits: Paranasal sinuses and mastoid air  cells are clear. Bilateral lens replacement. Otherwise orbits are unremarkable. Other: No large scalp hematoma. CT CERVICAL SPINE FINDINGS Alignment: Stable grade 1 anterolisthesis of C3 on C4 and mild retrolisthesis of C4 on C5. Skull base and vertebrae: Multilevel moderate degenerative changes of the spine. Redemonstration of right facet joint fusion at the C2 through C4 levels. No acute fracture. No aggressive appearing focal osseous lesion or focal pathologic process. Soft tissues and spinal canal: No prevertebral fluid or swelling. No visible canal hematoma. Upper chest: Biapical pleural/pulmonary scarring. Other: 1 cm right thyroid gland hypodense lesion. Not clinically significant; no follow-up imaging recommended (ref: J Am Coll Radiol. 2015 Feb;12(2): 143-50). IMPRESSION: 1. No acute intracranial abnormality. 2. No acute displaced fracture or traumatic listhesis of the cervical spine. Electronically Signed   By: Tish Frederickson M.D.   On: 02/02/2022 18:24    Procedures .Marland KitchenLaceration Repair  Date/Time: 02/02/2022 7:22 PM  Performed by: Jeannie Fend, PA-C Authorized by: Jeannie Fend, PA-C   Consent:    Consent obtained:  Verbal   Consent given by:  Healthcare agent   Risks discussed:  Infection, need for additional repair, pain, poor cosmetic result and poor wound healing   Alternatives discussed:  No treatment and delayed treatment Universal protocol:    Procedure explained and questions answered to patient or proxy's satisfaction: yes     Relevant documents present and verified: yes     Test results available: yes     Imaging studies available: yes     Required blood products, implants, devices, and special equipment available: yes     Site/side marked: yes     Immediately prior to procedure, a time out was called: yes     Patient identity confirmed:  Verbally with patient Anesthesia:    Anesthesia method:  None Laceration details:    Location:  Scalp   Scalp location:   Occipital   Length (cm):  1   Depth (mm):  5 Pre-procedure details:    Preparation:  Patient was prepped and draped in usual sterile fashion and imaging obtained to evaluate for foreign bodies Exploration:    Hemostasis achieved with:  Direct pressure   Contaminated: no   Treatment:    Area cleansed with:  Saline   Amount of cleaning:  Standard   Irrigation solution:  Sterile saline Skin repair:    Repair method:  Staples   Number of staples:  2 Approximation:    Approximation:  Close Repair type:    Repair type:  Simple Post-procedure details:    Dressing:  Open (no dressing)   Procedure completion:  Tolerated well, no immediate complications     Medications Ordered in ED Medications  lidocaine-EPINEPHrine-tetracaine (LET) topical gel (  3 mLs Topical Given 02/02/22 1823)    ED Course/ Medical Decision Making/ A&P                           Medical Decision Making Amount and/or Complexity of Data Reviewed Radiology: ordered.   This patient presents to the ED for concern of laceration of the scalp after fall at facility today, this involves an extensive number of treatment options, and is a complaint that carries with it a high risk of complications and morbidity.  The differential diagnosis includes but not limited to intracranial injury, scalp laceration, C-spine injury, pelvic fracture   Co morbidities that complicate the patient evaluation  Dementia, Alzheimer   Additional history obtained:  Additional history obtained from son at bedside who notes patient is more groggy than usual. Additional history obtained from EMS who notes patient was given her Klonopin just prior to her fall. External records from outside source obtained and reviewed including prior imaging from January 10, 2022 including CT head, C-spine, portable chest and pelvis  Imaging Studies ordered:  I ordered imaging studies including x-ray pelvis, CT head, C-spine I independently visualized and  interpreted imaging which showed no acute injury I agree with the radiologist interpretation  Problem List / ED Course / Critical interventions / Medication management  87 year old female brought in by EMS for evaluation after a fall.  Patient is not on thinners.  Is found to have a small laceration to the occipital area.  No other injuries identified.  She has normal range of motion of her upper and lower extremities without pain. I ordered medication including let for laceration Reevaluation of the patient after these medicines showed that the patient stayed the same I have reviewed the patients home medicines and have made adjustments as needed   Social Determinants of Health:  Lives at facility   Test / Admission - Considered:  Consider labs however history seems to be compatible with mechanical fall         Final Clinical Impression(s) / ED Diagnoses Final diagnoses:  Fall, initial encounter  Laceration of scalp, initial encounter    Rx / DC Orders ED Discharge Orders     None         Jeannie Fend, PA-C 02/02/22 1927    Virgina Norfolk, DO 02/02/22 2019

## 2022-02-09 ENCOUNTER — Emergency Department (HOSPITAL_COMMUNITY): Payer: PPO

## 2022-02-09 ENCOUNTER — Emergency Department (HOSPITAL_COMMUNITY)
Admission: EM | Admit: 2022-02-09 | Discharge: 2022-02-09 | Disposition: A | Discharge status: Skilled Nursing Facility | Payer: PPO | Attending: Emergency Medicine | Admitting: Emergency Medicine

## 2022-02-09 ENCOUNTER — Other Ambulatory Visit: Payer: Self-pay

## 2022-02-09 ENCOUNTER — Encounter (HOSPITAL_COMMUNITY): Payer: Self-pay | Admitting: Emergency Medicine

## 2022-02-09 DIAGNOSIS — S1190XA Unspecified open wound of unspecified part of neck, initial encounter: Secondary | ICD-10-CM | POA: Diagnosis not present

## 2022-02-09 DIAGNOSIS — F0280 Dementia in other diseases classified elsewhere without behavioral disturbance: Secondary | ICD-10-CM | POA: Insufficient documentation

## 2022-02-09 DIAGNOSIS — S0190XA Unspecified open wound of unspecified part of head, initial encounter: Secondary | ICD-10-CM | POA: Diagnosis not present

## 2022-02-09 DIAGNOSIS — G309 Alzheimer's disease, unspecified: Secondary | ICD-10-CM | POA: Diagnosis not present

## 2022-02-09 DIAGNOSIS — S0101XD Laceration without foreign body of scalp, subsequent encounter: Secondary | ICD-10-CM

## 2022-02-09 DIAGNOSIS — S0990XA Unspecified injury of head, initial encounter: Secondary | ICD-10-CM | POA: Diagnosis not present

## 2022-02-09 DIAGNOSIS — S0101XA Laceration without foreign body of scalp, initial encounter: Secondary | ICD-10-CM | POA: Diagnosis not present

## 2022-02-09 DIAGNOSIS — R58 Hemorrhage, not elsewhere classified: Secondary | ICD-10-CM | POA: Diagnosis not present

## 2022-02-09 DIAGNOSIS — W19XXXA Unspecified fall, initial encounter: Secondary | ICD-10-CM | POA: Insufficient documentation

## 2022-02-09 DIAGNOSIS — R079 Chest pain, unspecified: Secondary | ICD-10-CM | POA: Diagnosis not present

## 2022-02-09 DIAGNOSIS — R102 Pelvic and perineal pain: Secondary | ICD-10-CM | POA: Diagnosis not present

## 2022-02-09 MED ORDER — LORAZEPAM 1 MG PO TABS
1.0000 mg | ORAL_TABLET | Freq: Once | ORAL | Status: AC
  Administered 2022-02-09: 1 mg via ORAL
  Filled 2022-02-09: qty 1

## 2022-02-09 NOTE — ED Provider Notes (Signed)
[facility] EMERGENCY DEPARTMENT Provider Note   CSN: [id number] Arrival date & time: 02/09/22  1825     History  Chief Complaint  Patient presents with   Jaclyn Robertson is a 87 y.o. female who presents to the emergency department after a fall. She had a fall on 7/21 and repair of head laceration requiring 2 staples. Patient had staples removed this afternoon and then fell afterwards, opening the wound again. Hx of Alzheimer's dementia, coming from [facility] unit. Attempted numerous times to speak to facility and was not able to get ahold of anyone.   Level 5 caveat due to dementia   Fall       Home Medications Prior to Admission medications   Medication Sig Start Date End Date Taking? Authorizing Provider  ALPRAZolam (XANAX) 0.25 MG tablet Take 1 tablet (0.25 mg total) by mouth 2 (two) times daily as needed for anxiety. 09/10/20   Mercy Riding, MD  Cholecalciferol (VITAMIN D-3) 125 MCG (5000 UT) TABS Take 1 tablet by mouth daily. Patient taking differently: Take 5,000 Units by mouth daily. 10/06/19   Hilts, Legrand Como, MD  divalproex (DEPAKOTE) 125 MG DR tablet Take 1 tablet (125 mg total) by mouth daily. 09/10/20   Mercy Riding, MD  escitalopram (LEXAPRO) 20 MG tablet Take 1 tablet (20 mg total) by mouth daily. 09/10/20   Mercy Riding, MD  Melatonin 5 MG CAPS Take 1 capsule (5 mg total) by mouth at bedtime. 09/10/20   Mercy Riding, MD  memantine (NAMENDA) 10 MG tablet Take 1 tablet (10 mg total) by mouth 2 (two) times daily. 05/13/20   Cameron Sprang, MD  Menatetrenone (VITAMIN K2) 100 MCG TABS Take 100 mcg by mouth daily. 10/06/19   Hilts, Legrand Como, MD  raloxifene (EVISTA) 60 MG tablet Take 60 mg by mouth once daily Patient taking differently: Take 60 mg by mouth daily. 11/02/19   Claretta Fraise, MD  traZODone (DESYREL) 50 MG tablet Take 1 tablet (50 mg total) by mouth at bedtime. 09/10/20   Mercy Riding, MD      Allergies     Levaquin [levofloxacin in d5w]    Review of Systems   Review of Systems  Unable to perform ROS: Dementia    Physical Exam Updated Vital Signs BP 122/85 (BP Location: Left Arm)   Pulse (!) 102   Temp 99.3 F (37.4 C) (Oral)   Resp 18   Ht 5\' 5"  (1.651 m)   Wt 59 kg   SpO2 95%   BMI 21.63 kg/m  Physical Exam Vitals and nursing note reviewed.  Constitutional:      Appearance: Normal appearance.  HENT:     Head: Normocephalic.     Comments: Small approximately 1.5 linear gaping laceration to occiput with minimal bleeding Eyes:     Conjunctiva/sclera: Conjunctivae normal.  Pulmonary:     Effort: Pulmonary effort is normal. No respiratory distress.  Chest:     Comments: Chest wall stable Abdominal:     General: Abdomen is flat.     Palpations: Abdomen is soft.  Musculoskeletal:     Comments: Pelvis stable. No midline spinal tenderness, step offs or crepitus.   Skin:    General: Skin is warm and dry.  Neurological:     Mental Status: She is alert.     Comments: Oriented to person  Psychiatric:        Mood and Affect:  Mood normal.        Behavior: Behavior normal.     ED Results / Procedures / Treatments   Labs (all labs ordered are listed, but only abnormal results are displayed) Labs Reviewed - No data to display  EKG None  Radiology CT Head Wo Contrast  Result Date: 02/09/2022 CLINICAL DATA:  Fall with wound EXAM: CT HEAD WITHOUT CONTRAST CT CERVICAL SPINE WITHOUT CONTRAST TECHNIQUE: Multidetector CT imaging of the head and cervical spine was performed following the standard protocol without intravenous contrast. Multiplanar CT image reconstructions of the cervical spine were also generated. RADIATION DOSE REDUCTION: This exam was performed according to the departmental dose-optimization program which includes automated exposure control, adjustment of the mA and/or kV according to patient size and/or use of iterative reconstruction technique. COMPARISON:  CT  brain and cervical spine 02/02/2022, 01/10/2022, 07/09/2021 FINDINGS: CT HEAD FINDINGS Brain: No acute territorial infarction, hemorrhage or intracranial mass. Advanced atrophy. Prominent ventricles secondary to atrophy. Mild chronic small vessel ischemic changes of the white matter Vascular: No hyperdense vessels.  Carotid vascular calcification Skull: Normal. Negative for fracture or focal lesion. Sinuses/Orbits: No acute finding. Other: None CT CERVICAL SPINE FINDINGS Alignment: Trace anterolisthesis C4 on C5 without change. Generalized straightening. Facet alignment within normal limits Skull base and vertebrae: No acute fracture. No primary bone lesion or focal pathologic process. Soft tissues and spinal canal: No prevertebral fluid or swelling. No visible canal hematoma. Disc levels: Multilevel degenerative change. Moderate severe disc space narrowing C5-C6 and C6-C7. Facet degenerative changes at multiple levels. Fusion of the right facets C2 through C4. Upper chest: Apical scarring. 10 mm right thyroid nodule. Not clinically significant; no follow-up imaging recommended (ref: J Am Coll Radiol. 2015 Feb;12(2): 143-50). Other: Negative IMPRESSION: 1. No CT evidence for acute intracranial abnormality. Atrophy and chronic small vessel ischemic changes of the white matter 2. Degenerative changes of the cervical spine. No acute osseous abnormality Electronically Signed   By: Donavan Foil M.D.   On: 02/09/2022 20:09   CT Cervical Spine Wo Contrast  Result Date: 02/09/2022 CLINICAL DATA:  Fall with wound EXAM: CT HEAD WITHOUT CONTRAST CT CERVICAL SPINE WITHOUT CONTRAST TECHNIQUE: Multidetector CT imaging of the head and cervical spine was performed following the standard protocol without intravenous contrast. Multiplanar CT image reconstructions of the cervical spine were also generated. RADIATION DOSE REDUCTION: This exam was performed according to the departmental dose-optimization program which includes  automated exposure control, adjustment of the mA and/or kV according to patient size and/or use of iterative reconstruction technique. COMPARISON:  CT brain and cervical spine 02/02/2022, 01/10/2022, 07/09/2021 FINDINGS: CT HEAD FINDINGS Brain: No acute territorial infarction, hemorrhage or intracranial mass. Advanced atrophy. Prominent ventricles secondary to atrophy. Mild chronic small vessel ischemic changes of the white matter Vascular: No hyperdense vessels.  Carotid vascular calcification Skull: Normal. Negative for fracture or focal lesion. Sinuses/Orbits: No acute finding. Other: None CT CERVICAL SPINE FINDINGS Alignment: Trace anterolisthesis C4 on C5 without change. Generalized straightening. Facet alignment within normal limits Skull base and vertebrae: No acute fracture. No primary bone lesion or focal pathologic process. Soft tissues and spinal canal: No prevertebral fluid or swelling. No visible canal hematoma. Disc levels: Multilevel degenerative change. Moderate severe disc space narrowing C5-C6 and C6-C7. Facet degenerative changes at multiple levels. Fusion of the right facets C2 through C4. Upper chest: Apical scarring. 10 mm right thyroid nodule. Not clinically significant; no follow-up imaging recommended (ref: J Am Coll Radiol. 2015 Feb;12(2): 143-50). Other: Negative  IMPRESSION: 1. No CT evidence for acute intracranial abnormality. Atrophy and chronic small vessel ischemic changes of the white matter 2. Degenerative changes of the cervical spine. No acute osseous abnormality Electronically Signed   By: Jasmine Pang M.D.   On: 02/09/2022 20:09   DG Chest 1 View  Result Date: 02/09/2022 CLINICAL DATA:  Recent fall with chest pain, initial encounter EXAM: PORTABLE CHEST 1 VIEW COMPARISON:  01/10/2022 FINDINGS: Cardiac shadow is enlarged but stable. Tortuous thoracic aorta is noted. Lungs are well aerated bilaterally. No focal infiltrate or effusion is seen. Considerable extrinsic artifact is  noted related to the patient's clothing. No acute bony abnormality is seen. IMPRESSION: No acute abnormality noted. Electronically Signed   By: Alcide Clever M.D.   On: 02/09/2022 19:41   DG Pelvis 1-2 Views  Result Date: 02/09/2022 CLINICAL DATA:  Recent fall with pelvic pain, initial encounter EXAM: PELVIS - 1-2 VIEW COMPARISON:  02/02/2022 FINDINGS: Pelvic ring is intact. Mild degenerative changes of the lumbar spine are seen. No acute fracture is seen. No soft tissue changes are noted. IMPRESSION: No acute abnormality noted. Electronically Signed   By: Alcide Clever M.D.   On: 02/09/2022 19:40    Procedures .Marland KitchenLaceration Repair  Date/Time: 02/09/2022 9:25 PM  Performed by: Su Monks, PA-C Authorized by: Su Monks, PA-C   Consent:    Consent obtained:  Emergent situation (Dementia patient) Universal protocol:    Patient identity confirmed:  Provided demographic data Anesthesia:    Anesthesia method:  None Laceration details:    Location:  Scalp   Scalp location:  Occipital   Length (cm):  1.5 Pre-procedure details:    Preparation:  Patient was prepped and draped in usual sterile fashion Exploration:    Hemostasis achieved with:  Direct pressure   Imaging obtained comment:  CT head   Imaging outcome: foreign body not noted   Treatment:    Area cleansed with:  Saline   Amount of cleaning:  Standard Skin repair:    Repair method:  Staples   Number of staples:  2 Approximation:    Approximation:  Close Repair type:    Repair type:  Simple Post-procedure details:    Dressing:  Open (no dressing)   Procedure completion:  Tolerated well, no immediate complications     Medications Ordered in ED Medications  LORazepam (ATIVAN) tablet 1 mg (1 mg Oral Given 02/09/22 2109)    ED Course/ Medical Decision Making/ A&P                           Medical Decision Making Amount and/or Complexity of Data Reviewed Radiology: ordered.  Risk Prescription drug  management.  This patient is a 87 y.o. female  who presents to the ED for concern of fall with reopening of head wound. Hx of Alzheimer's dementia.    Past Medical History / Co-morbidities: Alzheimer's dementia, osteoporosis  Additional history: Chart reviewed. Pertinent results include: Patient seen on 7/21 after fall with head injury, had 2 staples placed in occiput.   Based on previous notes, patient appears to be at her baseline mental status.   Physical Exam: Physical exam performed. The pertinent findings include: Hypertensive, otherwise normal vital signs. 1.5 cm gaping laceration to occiput.    Lab Tests/Imaging studies: I personally interpreted labs/imaging and the pertinent results include: CT head, cervical spine, chest x-ray, pelvis x-ray showed no acute traumatic findings.    Medications: I ordered medication  including ativan for agitation.  I have reviewed the patients home medicines and have made adjustments as needed. She also required nonviolent restraints after numerous attempts to throw herself out of bed.   Procedures: Laceration closed with 2 sutures. Patient tolerated procedure well.    Disposition: After consideration of the diagnostic results and the patients response to treatment, I feel that emergency department workup does not suggest an emergent condition requiring admission or immediate intervention beyond what has been performed at this time. The plan is: discharge back to facility, staple removal in 10 days. The patient is safe for discharge and has been instructed to return immediately for worsening symptoms, change in symptoms or any other concerns.         Final Clinical Impression(s) / ED Diagnoses Final diagnoses:  Fall, initial encounter  Laceration of scalp, subsequent encounter    Rx / DC Orders ED Discharge Orders     None      Portions of this report may have been transcribed using voice recognition software. Every effort was made  to ensure accuracy; however, inadvertent computerized transcription errors may be present.    Jeanella Flattery 02/09/22 2159    Pricilla Loveless, MD 02/12/22 1723

## 2022-02-09 NOTE — Discharge Instructions (Addendum)
Jaclyn Robertson was seen in the ER after a fall.  We reimaged her and did not find any injury besides her head wound opening. We closed this with two staples again. She should have these removed in about 10 days.

## 2022-02-09 NOTE — ED Triage Notes (Signed)
Pt arrives via EMS from [facility] nursing home where patient just had staples removed from her head from previous fall. Today with fall again. Wound reopened. Pt confused at baseline, hx of dementia. Attempting to get out of bed. Unable to follow commands. 118/76, 70, 96%, RR 18. No thinners no LOC.

## 2022-02-09 NOTE — ED Notes (Signed)
Trauma Event Note   TRN observed patient attempting to get out of bed, multiple attempts to be redirected by nursing staff and PA-C unsuccessful. Placed in trendelenburg by RN and mitts applied in attempts to maintain patient safety without success, pt continues to attempt to get out of bed. Kicking legs up and rocking herself forward in attempts to use her momentum to get out of bed. Admitted to ED this evening after a fall with head injury, hx dementia. Obtained order for posey belt from PA-C and applied by TRN and primary RN Danielle and [employer] per policy.  Last imported Vital Signs BP (!) 154/79 (BP Location: Left Arm)   Pulse 81   Temp 98.3 F (36.8 C) (Oral)   Resp 14   Ht 5\' 5"  (1.651 m)   Wt 130 lb (59 kg)   SpO2 94%   BMI 21.63 kg/m    Jaclyn Robertson [name]  Trauma Response RN  Please call TRN at [phone] for further assistance.

## 2022-02-22 DIAGNOSIS — Z79899 Other long term (current) drug therapy: Secondary | ICD-10-CM | POA: Diagnosis not present

## 2022-02-22 DIAGNOSIS — G301 Alzheimer's disease with late onset: Secondary | ICD-10-CM | POA: Diagnosis not present

## 2022-03-01 ENCOUNTER — Emergency Department (HOSPITAL_COMMUNITY): Payer: PPO

## 2022-03-01 ENCOUNTER — Emergency Department (HOSPITAL_COMMUNITY)
Admission: EM | Admit: 2022-03-01 | Discharge: 2022-03-01 | Disposition: A | Discharge status: Home / Self Care | Payer: PPO | Attending: Emergency Medicine | Admitting: Emergency Medicine

## 2022-03-01 ENCOUNTER — Other Ambulatory Visit: Payer: Self-pay

## 2022-03-01 DIAGNOSIS — W1830XA Fall on same level, unspecified, initial encounter: Secondary | ICD-10-CM | POA: Insufficient documentation

## 2022-03-01 DIAGNOSIS — S0181XA Laceration without foreign body of other part of head, initial encounter: Secondary | ICD-10-CM | POA: Diagnosis not present

## 2022-03-01 DIAGNOSIS — R9431 Abnormal electrocardiogram [ECG] [EKG]: Secondary | ICD-10-CM | POA: Diagnosis not present

## 2022-03-01 DIAGNOSIS — S0990XA Unspecified injury of head, initial encounter: Secondary | ICD-10-CM | POA: Diagnosis not present

## 2022-03-01 DIAGNOSIS — S01412A Laceration without foreign body of left cheek and temporomandibular area, initial encounter: Secondary | ICD-10-CM | POA: Diagnosis not present

## 2022-03-01 DIAGNOSIS — M50322 Other cervical disc degeneration at C5-C6 level: Secondary | ICD-10-CM | POA: Diagnosis not present

## 2022-03-01 DIAGNOSIS — W19XXXA Unspecified fall, initial encounter: Secondary | ICD-10-CM | POA: Diagnosis not present

## 2022-03-01 DIAGNOSIS — F0390 Unspecified dementia without behavioral disturbance: Secondary | ICD-10-CM | POA: Diagnosis not present

## 2022-03-01 MED ORDER — HALOPERIDOL LACTATE 5 MG/ML IJ SOLN
2.5000 mg | Freq: Once | INTRAMUSCULAR | Status: AC
  Administered 2022-03-01: 2.5 mg via INTRAMUSCULAR
  Filled 2022-03-01: qty 1

## 2022-03-01 MED ORDER — LORAZEPAM 2 MG/ML IJ SOLN
1.0000 mg | Freq: Once | INTRAMUSCULAR | Status: AC
  Administered 2022-03-01: 1 mg via INTRAMUSCULAR
  Filled 2022-03-01: qty 1

## 2022-03-01 MED ORDER — LIDOCAINE-EPINEPHRINE (PF) 2 %-1:200000 IJ SOLN
10.0000 mL | Freq: Once | INTRAMUSCULAR | Status: DC
  Filled 2022-03-01: qty 20

## 2022-03-01 MED ORDER — LORAZEPAM 2 MG/ML IJ SOLN
1.0000 mg | Freq: Once | INTRAMUSCULAR | Status: AC
Start: 2022-03-01 — End: 2022-03-01
  Administered 2022-03-01: 1 mg via INTRAMUSCULAR
  Filled 2022-03-01: qty 1

## 2022-03-01 MED ORDER — LORAZEPAM 2 MG/ML IJ SOLN
0.5000 mg | Freq: Once | INTRAMUSCULAR | Status: AC
  Administered 2022-03-01: 0.5 mg via INTRAMUSCULAR
  Filled 2022-03-01: qty 1

## 2022-03-01 NOTE — ED Provider Notes (Signed)
[facility]-EMERGENCY DEPT Provider Note   CSN: [id number] Arrival date & time: 03/01/22  0741     History  Chief Complaint  Patient presents with   Fall    laceration    Jaclyn Robertson is a 87 y.o. female.  87 year old female with prior medical history as detailed below presents for evaluation from [facility].  Patient had a fall this morning.  Patient with laceration to the left cheek.  Patient is at baseline mental status.  Tetanus is up-to-date.  Patient with multiple bruises from other falls in the recent past.  Patient without complaint of pain or discomfort on exam.  The history is provided by the patient, medical records and the EMS personnel.  Fall The current episode started 3 to 5 hours ago. The problem occurs every several days. The problem has not changed since onset.Nothing aggravates the symptoms. Nothing relieves the symptoms.       Home Medications Prior to Admission medications   Medication Sig Start Date End Date Taking? Authorizing Provider  ALPRAZolam (XANAX) 0.25 MG tablet Take 1 tablet (0.25 mg total) by mouth 2 (two) times daily as needed for anxiety. 09/10/20   Almon Hercules, MD  Cholecalciferol (VITAMIN D-3) 125 MCG (5000 UT) TABS Take 1 tablet by mouth daily. Patient taking differently: Take 5,000 Units by mouth daily. 10/06/19   Hilts, Casimiro Needle, MD  divalproex (DEPAKOTE) 125 MG DR tablet Take 1 tablet (125 mg total) by mouth daily. 09/10/20   Almon Hercules, MD  escitalopram (LEXAPRO) 20 MG tablet Take 1 tablet (20 mg total) by mouth daily. 09/10/20   Almon Hercules, MD  Melatonin 5 MG CAPS Take 1 capsule (5 mg total) by mouth at bedtime. 09/10/20   Almon Hercules, MD  memantine (NAMENDA) 10 MG tablet Take 1 tablet (10 mg total) by mouth 2 (two) times daily. 05/13/20   Van Clines, MD  Menatetrenone (VITAMIN K2) 100 MCG TABS Take 100 mcg by mouth daily. 10/06/19   Hilts, Casimiro Needle, MD  raloxifene (EVISTA) 60 MG tablet Take 60 mg  by mouth once daily Patient taking differently: Take 60 mg by mouth daily. 11/02/19   Mechele Claude, MD  traZODone (DESYREL) 50 MG tablet Take 1 tablet (50 mg total) by mouth at bedtime. 09/10/20   Almon Hercules, MD      Allergies    Levaquin [levofloxacin in d5w]    Review of Systems   Review of Systems  All other systems reviewed and are negative.   Physical Exam Updated Vital Signs BP (!) 140/112   Pulse 76   Temp 97.7 F (36.5 C) (Oral)   Resp 19   SpO2 99%  Physical Exam Vitals and nursing note reviewed.  Constitutional:      General: She is not in acute distress.    Appearance: Normal appearance. She is well-developed.  HENT:     Head: Normocephalic.     Comments: 1 inch laceration to the left cheek.  No active bleeding noted.  Multiple areas of healing ecchymosis across the scalp and right face. Eyes:     Conjunctiva/sclera: Conjunctivae normal.     Pupils: Pupils are equal, round, and reactive to light.  Cardiovascular:     Rate and Rhythm: Normal rate and regular rhythm.     Heart sounds: Normal heart sounds.  Pulmonary:     Effort: Pulmonary effort is normal. No respiratory distress.     Breath sounds: Normal breath sounds.  Abdominal:     General: There is no distension.     Palpations: Abdomen is soft.     Tenderness: There is no abdominal tenderness.  Musculoskeletal:        General: No deformity. Normal range of motion.     Cervical back: Normal range of motion and neck supple.  Skin:    General: Skin is warm and dry.  Neurological:     General: No focal deficit present.     Mental Status: She is alert. Mental status is at baseline.     ED Results / Procedures / Treatments   Labs (all labs ordered are listed, but only abnormal results are displayed) Labs Reviewed - No data to display  EKG EKG Interpretation  Date/Time:  Thursday March 01 2022 07:54:41 EDT Ventricular Rate:  58 PR Interval:  136 QRS Duration: 93 QT Interval:  448 QTC  Calculation: 440 R Axis:   61 Text Interpretation: Sinus rhythm Confirmed by Kristine Royal [phone]) on 03/01/2022 8:06:25 AM  Radiology No results found.  Procedures .Marland KitchenLaceration Repair  Date/Time: 03/01/2022 9:15 AM  Performed by: Wynetta Fines, MD Authorized by: Wynetta Fines, MD   Consent:    Consent obtained:  Verbal   Consent given by:  Patient   Risks, benefits, and alternatives were discussed: yes     Risks discussed:  Infection, need for additional repair, nerve damage, poor wound healing, poor cosmetic result, pain, retained foreign body, tendon damage and vascular damage   Alternatives discussed:  No treatment Universal protocol:    Immediately prior to procedure, a time out was called: yes     Patient identity confirmed:  Verbally with patient Anesthesia:    Anesthesia method:  Local infiltration   Local anesthetic:  Lidocaine 2% WITH epi Laceration details:    Location:  Face   Face location:  L cheek   Length (cm):  2.5 Pre-procedure details:    Preparation:  Patient was prepped and draped in usual sterile fashion and imaging obtained to evaluate for foreign bodies Exploration:    Limited defect created (wound extended): no     Hemostasis achieved with:  Direct pressure   Imaging outcome: foreign body not noted     Wound exploration: wound explored through full range of motion and entire depth of wound visualized     Wound extent: no areolar tissue violation noted, no fascia violation noted, no foreign bodies/material noted, no muscle damage noted, no nerve damage noted, no tendon damage noted, no underlying fracture noted and no vascular damage noted   Treatment:    Area cleansed with:  Povidone-iodine and saline   Amount of cleaning:  Standard   Irrigation solution:  Sterile saline   Irrigation method:  Syringe   Visualized foreign bodies/material removed: no     Debridement:  None   Undermining:  None Skin repair:    Repair method:  Sutures   Suture  size:  5-0   Wound skin closure material used: vicryl.   Suture technique:  Simple interrupted   Number of sutures:  5 Approximation:    Approximation:  Loose Repair type:    Repair type:  Simple Post-procedure details:    Dressing:  Sterile dressing   Procedure completion:  Tolerated     Medications Ordered in ED Medications  lidocaine-EPINEPHrine (XYLOCAINE W/EPI) 2 %-1:200000 (PF) injection 10 mL (has no administration in time range)  LORazepam (ATIVAN) injection 0.5 mg (0.5 mg Intramuscular Given 03/01/22 0847)  ED Course/ Medical Decision Making/ A&P                           Medical Decision Making Amount and/or Complexity of Data Reviewed Radiology: ordered.  Risk Prescription drug management.    Medical Screen Complete  This patient presented to the ED with complaint of fall, facial laceration.  This complaint involves an extensive number of treatment options. The initial differential diagnosis includes, but is not limited to, trauma related to fall, etc  This presentation is: Acute, Chronic, Self-Limited, Previously Undiagnosed, Uncertain Prognosis, Complicated, Systemic Symptoms, and Threat to Life/Bodily Function  Patient presents after fall at her facility.  She sustained a minor laceration to the left cheek.    Laceration repaired without difficulty.  Imaging obtained to rule out occult fracture or other traumatic pathology.  Patient's daughter updated regarding progression of case.  Patient is appropriate for discharge.  Patient's daughter agrees with plan for discharge.    Co morbidities that complicated the patient's evaluation  Dementia, frequent falls   Additional history obtained:  Additional history obtained from EMS External records from outside sources obtained and reviewed including prior ED visits and prior Inpatient records.   Imaging Studies ordered:  I ordered imaging studies including CT head, CT maxillofacial, CT C-spine   I independently visualized and interpreted obtained imaging which showed NAD I agree with the radiologist interpretation.   Cardiac Monitoring:  The patient was maintained on a cardiac monitor.  I personally viewed and interpreted the cardiac monitor which showed an underlying rhythm of: NSR   Medicines ordered:  I ordered medication including Ativan, Haldol for agitation Reevaluation of the patient after these medicines showed that the patient: improved   Problem List / ED Course:  Fall, facial laceration   Reevaluation:  After the interventions noted above, I reevaluated the patient and found that they have: improved   Disposition:  After consideration of the diagnostic results and the patients response to treatment, I feel that the patent would benefit from close outpatient follow-up.          Final Clinical Impression(s) / ED Diagnoses Final diagnoses:  Fall, initial encounter  Facial laceration, initial encounter    Rx / DC Orders ED Discharge Orders     None         Wynetta Fines, MD 03/01/22 1301

## 2022-03-01 NOTE — ED Notes (Signed)
Pt transfer to CT. 

## 2022-03-01 NOTE — Progress Notes (Signed)
Civil engineer, contracting [facility]) Hospital Liaison Note  This is a current [facility] hospice patient with a diagnosis of alzheimer's dementia. Please call with any questions or concerns. Thank you  Dionicio Stall, [facility] Liaison [phone]

## 2022-03-01 NOTE — ED Triage Notes (Signed)
Per EMS Pt. Is coming from [facility] care facility. Pt. Had an unwitnessed fall and staff was uncertain if she hit her head. Per staff Pt. Is @ their baseline from what they know. Pt. Has 1 1/2 inch laceration to the face and tear on her elbow. Per Ems Pt. Has a black eye d/t a previous fall as well. Facility unable to send paperwork d/t their printer being out. EMS also stated Pt. Is not on blood thinners.

## 2022-03-01 NOTE — ED Notes (Signed)
Pt. Restless, Provider notified, no new orders @ this time.

## 2022-03-01 NOTE — Progress Notes (Signed)
1000 attempted to scan pt again; pt was moving and almost fell off the table.Pt returned to room.  EWB  pt unable to cooperate for ct, RN to talk to MD about medicating patient [phone] [facility]

## 2022-03-01 NOTE — Discharge Instructions (Addendum)
Return for any problem.  Sutures placed today are absorbable and do not require removal.  Keep wound clean and dry.

## 2022-04-10 ENCOUNTER — Emergency Department (HOSPITAL_COMMUNITY)
Admission: EM | Admit: 2022-04-10 | Discharge: 2022-04-10 | Disposition: A | Discharge status: Home / Self Care | Payer: PPO | Attending: Emergency Medicine | Admitting: Emergency Medicine

## 2022-04-10 ENCOUNTER — Encounter (HOSPITAL_COMMUNITY): Payer: Self-pay | Admitting: *Deleted

## 2022-04-10 ENCOUNTER — Other Ambulatory Visit: Payer: Self-pay

## 2022-04-10 DIAGNOSIS — Z79899 Other long term (current) drug therapy: Secondary | ICD-10-CM | POA: Insufficient documentation

## 2022-04-10 DIAGNOSIS — W19XXXA Unspecified fall, initial encounter: Secondary | ICD-10-CM

## 2022-04-10 DIAGNOSIS — F0280 Dementia in other diseases classified elsewhere without behavioral disturbance: Secondary | ICD-10-CM | POA: Diagnosis not present

## 2022-04-10 DIAGNOSIS — S0003XA Contusion of scalp, initial encounter: Secondary | ICD-10-CM

## 2022-04-10 DIAGNOSIS — W01198A Fall on same level from slipping, tripping and stumbling with subsequent striking against other object, initial encounter: Secondary | ICD-10-CM | POA: Insufficient documentation

## 2022-04-10 DIAGNOSIS — G309 Alzheimer's disease, unspecified: Secondary | ICD-10-CM | POA: Diagnosis not present

## 2022-04-10 DIAGNOSIS — S0990XA Unspecified injury of head, initial encounter: Secondary | ICD-10-CM | POA: Diagnosis present

## 2022-04-10 DIAGNOSIS — R531 Weakness: Secondary | ICD-10-CM | POA: Diagnosis not present

## 2022-04-10 NOTE — ED Provider Notes (Signed)
[facility] EMERGENCY DEPARTMENT Provider Note   CSN: [id number] Arrival date & time: 04/10/22  [id number]     History  Chief Complaint  Patient presents with   Jaclyn Robertson    Jaclyn Robertson is a 87 y.o. female. With pmh Alzheimer's dementia and frequent falls who fell in her room and hit the back of her head trying to get around.  However, she should be getting around with her wheelchair which was not present.  She was noted to have a small abrasion to her posterior head that was bleeding controlled.  Patient is severely demented and unable to provide history.  She is denying any head pain or neck pain or pain in her extremities.  She is asking to try to walk around and get around the facility.  Spoke with Eugenie med tech from patient's nursing home [place] [facility].  Spoke with med tech that patient had unwitnessed fall in her room with no LOC. Usually gets around in a wheelchair due to frequent falls. She always feels like she can walk but she has not walked in many years so it leads   She knows her name at baseline but otherwise not oriented to year or time or situation.  Patient's daughter Jaclyn Robertson also called and spoke with the nurse who is taking care of this patient.  She did not want any CT scans or MRI or any work-up for her mother.  She requested only having her laceration evaluated and repaired if necessary.  Otherwise, she would like her mother back at her nursing facility.   Fall       Home Medications Prior to Admission medications   Medication Sig Start Date End Date Taking? Authorizing Provider  ALPRAZolam (XANAX) 0.25 MG tablet Take 1 tablet (0.25 mg total) by mouth 2 (two) times daily as needed for anxiety. 09/10/20   Mercy Riding, MD  Cholecalciferol (VITAMIN D-3) 125 MCG (5000 UT) TABS Take 1 tablet by mouth daily. Patient taking differently: Take 5,000 Units by mouth daily. 10/06/19   Hilts, Legrand Como, MD  divalproex (DEPAKOTE) 125 MG DR tablet  Take 1 tablet (125 mg total) by mouth daily. 09/10/20   Mercy Riding, MD  escitalopram (LEXAPRO) 20 MG tablet Take 1 tablet (20 mg total) by mouth daily. 09/10/20   Mercy Riding, MD  Melatonin 5 MG CAPS Take 1 capsule (5 mg total) by mouth at bedtime. 09/10/20   Mercy Riding, MD  memantine (NAMENDA) 10 MG tablet Take 1 tablet (10 mg total) by mouth 2 (two) times daily. 05/13/20   Cameron Sprang, MD  Menatetrenone (VITAMIN K2) 100 MCG TABS Take 100 mcg by mouth daily. 10/06/19   Hilts, Legrand Como, MD  raloxifene (EVISTA) 60 MG tablet Take 60 mg by mouth once daily Patient taking differently: Take 60 mg by mouth daily. 11/02/19   Claretta Fraise, MD  traZODone (DESYREL) 50 MG tablet Take 1 tablet (50 mg total) by mouth at bedtime. 09/10/20   Mercy Riding, MD      Allergies    Levaquin [levofloxacin in d5w]    Review of Systems   Review of Systems  Physical Exam Updated Vital Signs BP (!) 120/52   Pulse 88   Temp 97.6 F (36.4 C)   Resp 16   SpO2 97%  Physical Exam Constitutional: Alert and oriented to self, no acute distress, nontoxic Eyes: Conjunctivae are normal. ENT      Head: Normocephalic and atraumatic.  Nose: No congestion.      Mouth/Throat: Mucous membranes are moist.  No dental trauma      Neck: No stridor.  No midline tenderness step-offs or deformities of C/T/L-spine Cardiovascular: S1, S2,  Normal and symmetric distal pulses are present in all extremities.Warm and well perfused. Respiratory: Normal respiratory effort. Breath sounds are normal.  O2 sat 97 on RA. Gastrointestinal: Soft and nontender. There is no CVA tenderness. Musculoskeletal: Normal range of motion in all extremities. No tenderness with range of motion of bilateral upper and lower extremities. Neurologic: Mental baseline of orientation x1.  Moving all extremities intermittently.  Sensation grossly intact.  No facial droop. PERRL. EOMI. No focal deficits. Skin: Skin is warm, Psychiatric: Mood and  affect are normal. Speech and behavior are normal.  ED Results / Procedures / Treatments   Labs (all labs ordered are listed, but only abnormal results are displayed) Labs Reviewed - No data to display  EKG None  Radiology No results found.  Procedures Procedures    Medications Ordered in ED Medications - No data to display  ED Course/ Medical Decision Making/ A&P                           Medical Decision Making Jaclyn Robertson is a 87 y.o. female. With pmh Alzheimer's dementia and frequent falls who fell in her room and hit the back of her head trying to get around.   Patient is severely demented AAO x1 at baseline but is at her mental baseline today.  She is not in any acute distress and has stable vital signs.  She has a small superficial contusion and superficial abrasion that is hemostatic of her right parietal scalp.  She has no midline tenderness of the spine and is moving all extremities equally.  Her tetanus is up-to-date.  Was planning on obtaining CT head and C-spine but patient's daughter Jaclyn Robertson requested no imaging for patient and only evaluation of scalp contusion.  There were no other visualized injuries on body exam.  Since patient is at mental baseline and patient's daughter does not want any further work-up, plan to discharge patient back to her nursing home with return precautions.  Amount and/or Complexity of Data Reviewed Radiology: ordered.    Final Clinical Impression(s) / ED Diagnoses Final diagnoses:  Fall, initial encounter  Contusion of scalp, initial encounter    Rx / DC Orders ED Discharge Orders     None         Elgie Congo, MD 04/10/22 [phone]

## 2022-04-10 NOTE — ED Triage Notes (Signed)
Pt arrived with [facility] from the [place] at [facility] for unwitnessed fall. Hx of dementia ; abrasion to posterior head, bleeding controlled . C-collar in place. Pt denies pain

## 2022-04-10 NOTE — Discharge Instructions (Signed)
You have been seen in the Emergency Department (ED) today following a fall.  Your workup today did not reveal any injuries that require you to stay in the hospital. You can expect to be stiff and sore for the next several days.  Please take Tylenol or Motrin as needed for pain, but only as written on the box.  Please follow up with your primary care doctor as soon as possible regarding today's ED visit and your recent accident.   Call your doctor or return to the ED if you develop a sudden or severe headache, confusion, slurred speech, facial droop, weakness or numbness in any arm or leg,  extreme fatigue, vomiting more than two times, severe abdominal pain, difficulty breathing or any other concerning signs or symptoms.  

## 2022-04-18 DIAGNOSIS — K5904 Chronic idiopathic constipation: Secondary | ICD-10-CM | POA: Diagnosis not present

## 2022-04-18 DIAGNOSIS — Z993 Dependence on wheelchair: Secondary | ICD-10-CM | POA: Diagnosis not present

## 2022-04-18 DIAGNOSIS — G4701 Insomnia due to medical condition: Secondary | ICD-10-CM | POA: Diagnosis not present

## 2022-04-18 DIAGNOSIS — F0393 Unspecified dementia, unspecified severity, with mood disturbance: Secondary | ICD-10-CM | POA: Diagnosis not present

## 2022-04-30 DIAGNOSIS — G301 Alzheimer's disease with late onset: Secondary | ICD-10-CM | POA: Diagnosis not present

## 2022-04-30 DIAGNOSIS — G4701 Insomnia due to medical condition: Secondary | ICD-10-CM | POA: Diagnosis not present

## 2022-04-30 DIAGNOSIS — F339 Major depressive disorder, recurrent, unspecified: Secondary | ICD-10-CM | POA: Diagnosis not present

## 2022-04-30 DIAGNOSIS — F02C2 Dementia in other diseases classified elsewhere, severe, with psychotic disturbance: Secondary | ICD-10-CM | POA: Diagnosis not present

## 2022-05-16 DEATH — deceased

## 2022-11-08 IMAGING — CT CT HEAD CODE STROKE
4 series · 16 of 47 positions shown, 18 images · IV contrast (omnipaque)
Comparison: None.

CLINICAL DATA: Code stroke

EXAM:
CT ANGIOGRAPHY HEAD AND NECK
CT HEAD WITHOUT CONTRAST
TECHNIQUE: Multidetector CT imaging of the head and neck was performed using
the standard protocol during bolus administration of intravenous
contrast. Multiplanar CT image reconstructions and MIPs were
obtained to evaluate the vascular anatomy. Carotid stenosis
measurements (when applicable) are obtained utilizing NASCET
criteria, using the distal internal carotid diameter as the
denominator.
CONTRAST:  50mL OMNIPAQUE IOHEXOL 350 MG/ML SOLN

[Series 2: head wo · axial · 0.40mm/px · z∈[-99,+21]mm · 7 of 34 slices shown, 9 images]
[im 5/34  brain]
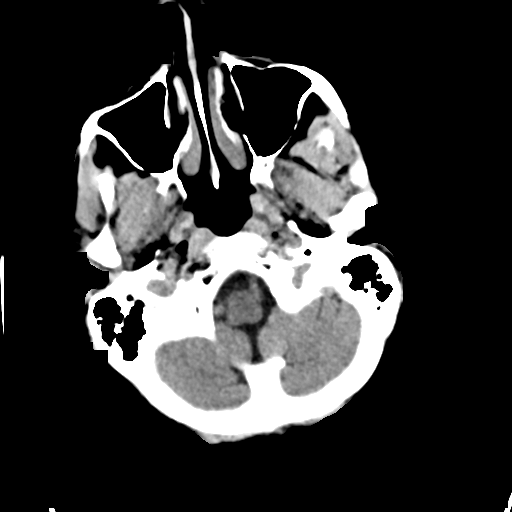
[im 5/34  bone]
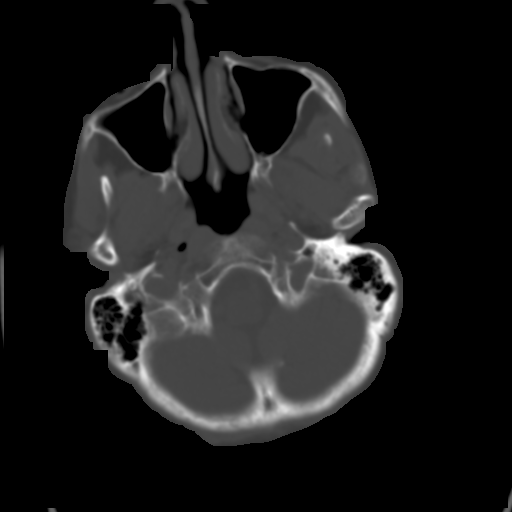
[im 9/34  brain]
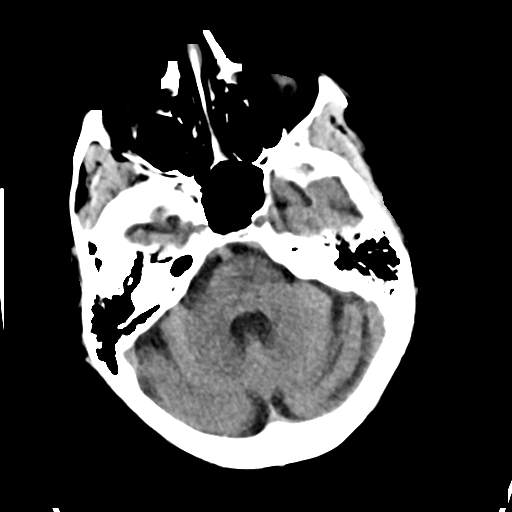
[im 13/34  brain]
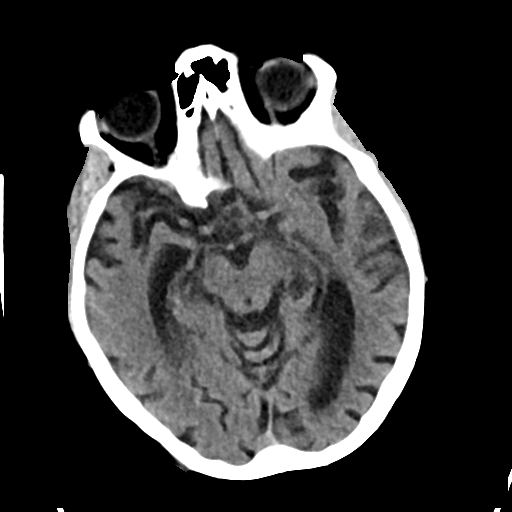
[im 17/34  brain]
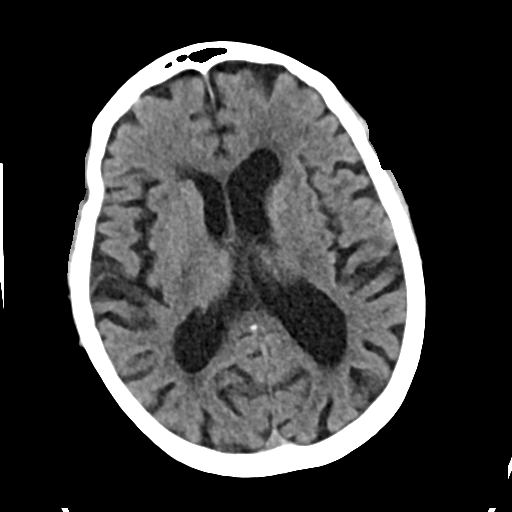
[im 21/34  brain]
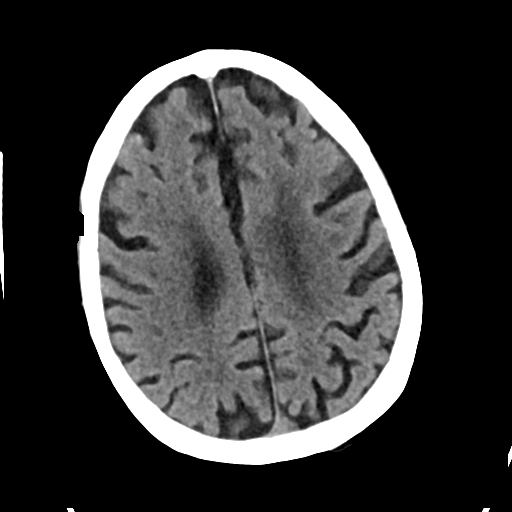
[im 21/34  bone]
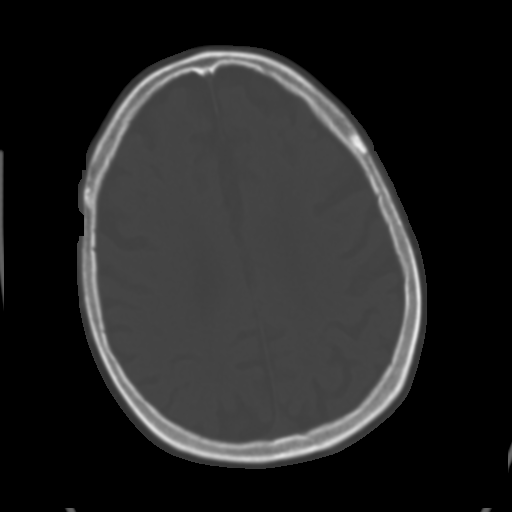
[im 25/34  brain]
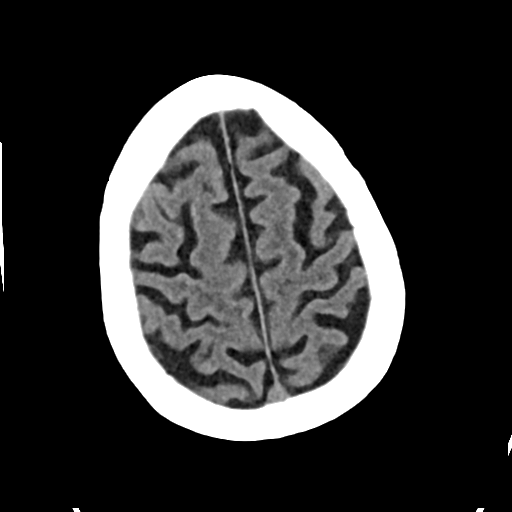
[im 29/34  brain]
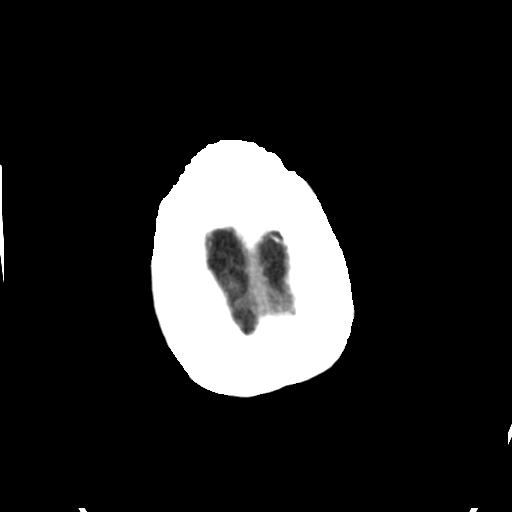

[Series 4: head bone · axial · 0.40mm/px · z∈[-103,-71]mm · 3 of 83 slices shown]
[im 9/83  bone]
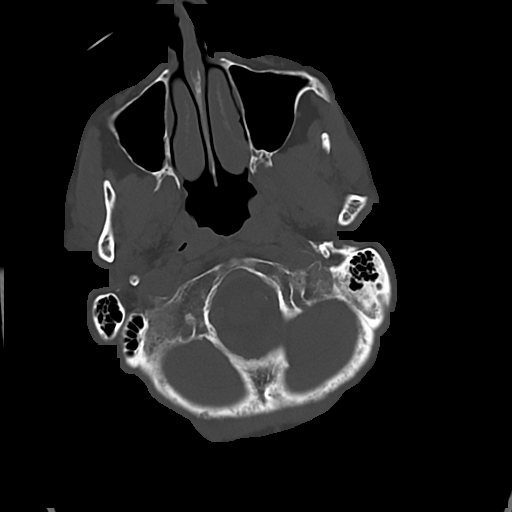
[im 17/83  bone]
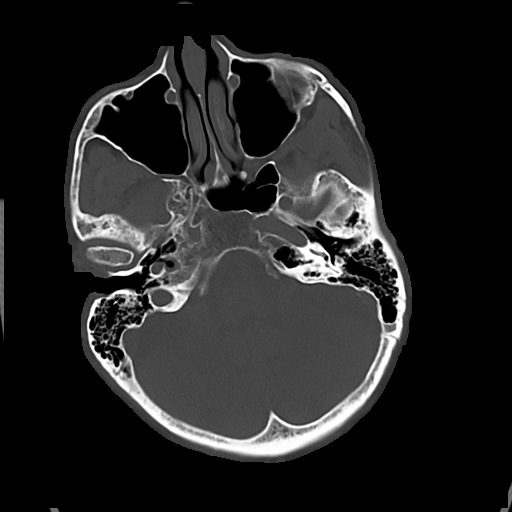
[im 25/83  bone]
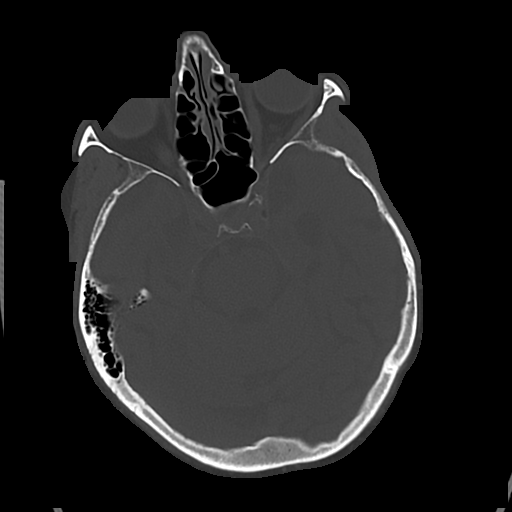

[Series 5: cor soft · coronal · 0.34mm/px · 3 of 66 slices shown]
[im 22/66  brain]
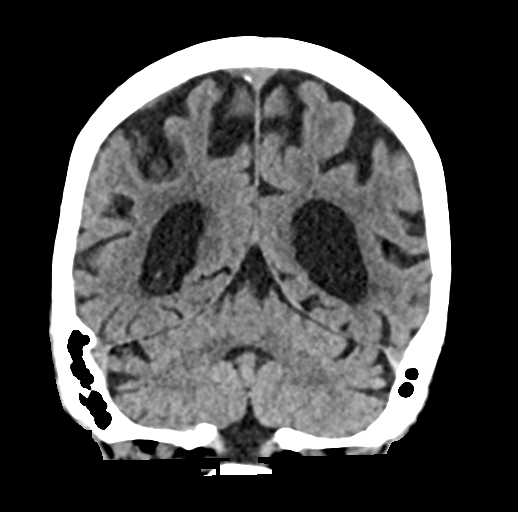
[im 29/66  brain]
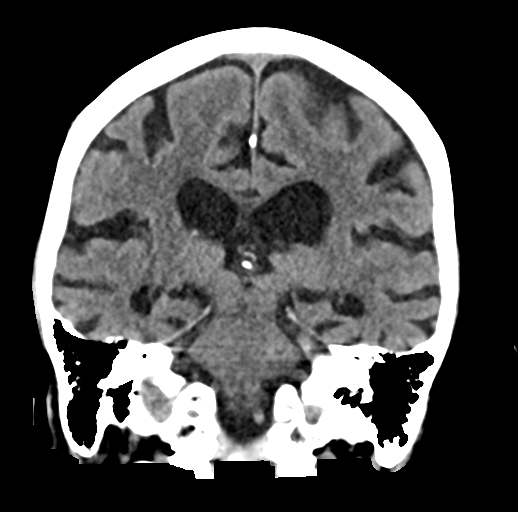
[im 37/66  brain]
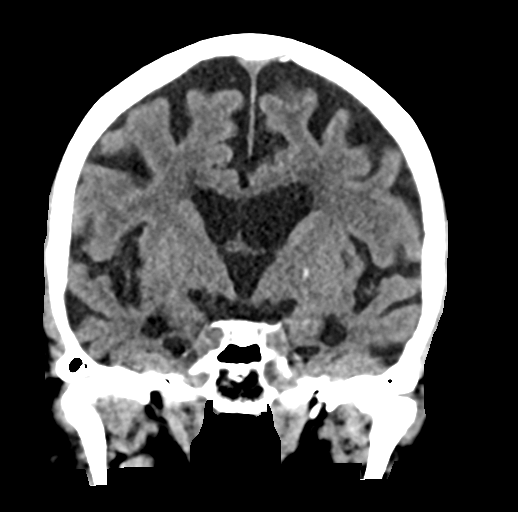

[Series 6: sag soft · sagittal · 0.34mm/px · 3 of 56 slices shown]
[im 19/56  brain]
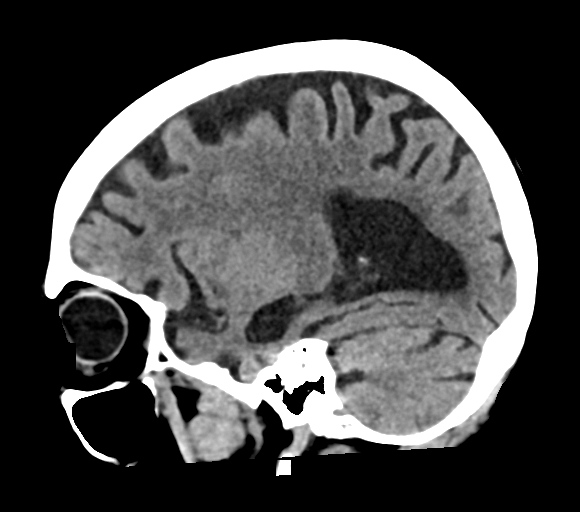
[im 28/56  brain]
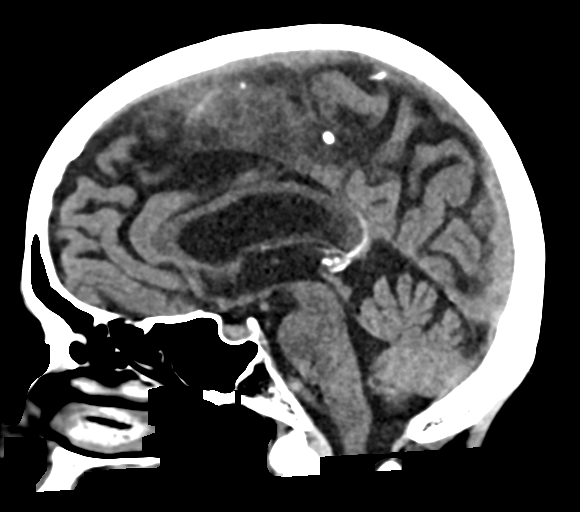
[im 37/56  brain]
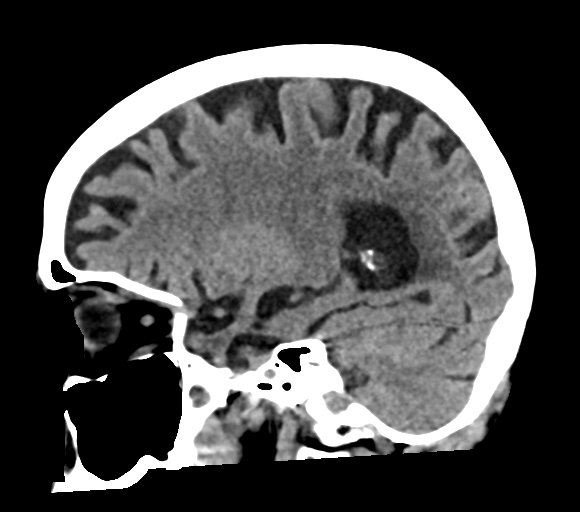

[16 of 47 positions shown; findings below may reference images not displayed]

FINDINGS: CT HEAD FINDINGS

Brain: No acute intracranial hemorrhage, mass effect, or edema.
Gray-white differentiation is preserved. Prominence of the
ventricles and sulci reflects generalized parenchymal volume loss.
Patchy hypoattenuation in the supratentorial white matter is
nonspecific but probably reflects mild to moderate chronic
microvascular ischemic changes. There is no extra-axial collection.

Vascular: No hyperdense vessel.

Skull: Unremarkable.

Sinuses/Orbits: Minor mucosal thickening. Bilateral lens
replacements.

Other: Mastoid air cells are clear.

ASPECTS (Alberta Stroke Program Early CT Score)

- Ganglionic level infarction (caudate, lentiform nuclei, internal
capsule, insula, M1-M3 cortex): 7

- Supraganglionic infarction (M4-M6 cortex): 3

Total score (0-10 with 10 being normal): 10

Review of the MIP images confirms the above findings

CTA NECK FINDINGS

Aortic arch: Mixed plaque along the visualized arch and patent great
vessel origins.

Right carotid system: Patent. No measurable stenosis at the ICA
origin.

Left carotid system: Patent. Trace calcified plaque at the ICA
origin. No measurable stenosis.

Vertebral arteries: Patent.  Left vertebral artery is dominant.

Skeleton: Cervical spine degenerative changes.

Other neck: Negative.

Upper chest: No apical lung mass.

Review of the MIP images confirms the above findings

CTA HEAD FINDINGS

Anterior circulation: Intracranial internal carotid arteries are
patent with mild calcified plaque. Anterior cerebral arteries are
patent with patent anterior communicating artery. Middle cerebral
arteries are patent.

Posterior circulation: Intracranial vertebral arteries, basilar
artery, and posterior cerebral arteries are patent. Major cerebellar
artery branch origins are patent.

Venous sinuses: As permitted by contrast timing, patent.

Review of the MIP images confirms the above findings
IMPRESSION: There is no acute intracranial hemorrhage or evidence of acute
infarction. ASPECT score is 10.

No large vessel occlusion or hemodynamically significant stenosis.

Initial results were provided by telephone at the time of
who verbally acknowledged these results.

## 2023-06-20 ENCOUNTER — Encounter (HOSPITAL_COMMUNITY): Payer: Self-pay | Admitting: *Deleted
